# Patient Record
Sex: Male | Born: 1975 | Race: White | Hispanic: No | Marital: Married | Smoking: Never smoker
Health system: Southern US, Community
[De-identification: ages and names within clinical notes are randomized; demographics above are authoritative.]

## PROBLEM LIST (undated history)

## (undated) DIAGNOSIS — I10 Essential (primary) hypertension: Secondary | ICD-10-CM

## (undated) DIAGNOSIS — E7800 Pure hypercholesterolemia, unspecified: Secondary | ICD-10-CM

## (undated) DIAGNOSIS — T7840XA Allergy, unspecified, initial encounter: Secondary | ICD-10-CM

## (undated) DIAGNOSIS — K219 Gastro-esophageal reflux disease without esophagitis: Secondary | ICD-10-CM

## (undated) HISTORY — DX: Allergy, unspecified, initial encounter: T78.40XA

## (undated) HISTORY — DX: Gastro-esophageal reflux disease without esophagitis: K21.9

## (undated) HISTORY — PX: HERNIA REPAIR: SHX51

---

## 2011-12-23 DIAGNOSIS — E7849 Other hyperlipidemia: Secondary | ICD-10-CM | POA: Insufficient documentation

## 2013-10-15 DIAGNOSIS — I10 Essential (primary) hypertension: Secondary | ICD-10-CM | POA: Insufficient documentation

## 2014-02-06 DIAGNOSIS — I2510 Atherosclerotic heart disease of native coronary artery without angina pectoris: Secondary | ICD-10-CM | POA: Insufficient documentation

## 2016-01-22 DIAGNOSIS — D126 Benign neoplasm of colon, unspecified: Secondary | ICD-10-CM | POA: Insufficient documentation

## 2016-06-24 ENCOUNTER — Ambulatory Visit
Admission: EM | Admit: 2016-06-24 | Discharge: 2016-06-24 | Disposition: A | Discharge status: Home / Self Care | Payer: BLUE CROSS/BLUE SHIELD | Attending: Family Medicine | Admitting: Family Medicine

## 2016-06-24 ENCOUNTER — Encounter: Payer: Self-pay | Admitting: Emergency Medicine

## 2016-06-24 DIAGNOSIS — T148XXA Other injury of unspecified body region, initial encounter: Secondary | ICD-10-CM | POA: Diagnosis not present

## 2016-06-24 DIAGNOSIS — W57XXXA Bitten or stung by nonvenomous insect and other nonvenomous arthropods, initial encounter: Secondary | ICD-10-CM | POA: Diagnosis not present

## 2016-06-24 MED ORDER — DOXYCYCLINE HYCLATE 100 MG PO CAPS: 100.0000 mg | ORAL_CAPSULE | Freq: Two times a day (BID) | ORAL | 0 refills | Status: DC

## 2016-06-24 MED ORDER — MUPIROCIN 2 % EX OINT: TOPICAL_OINTMENT | CUTANEOUS | 0 refills | Status: DC

## 2016-06-24 NOTE — Discharge Instructions (Signed)
Take medication as prescribed. Rest. Drink plenty of fluids.  ° °Follow up with your primary care physician this week as needed. Return to Urgent care for new or worsening concerns.  ° °

## 2016-06-24 NOTE — ED Provider Notes (Signed)
[facility] ____________________________________________  Time seen: Approximately 8:49 AM  I have reviewed the triage vital signs and the nursing notes.   HISTORY  Chief Complaint Insect Bite   HPI Jared Riley is a 40 y.o. male presents for evaluation after tick bite. Patient reports that this past Tuesday his wife found an engorged deer tick attached to his right back. Patient states unsure of how long tick was attached as he is frequently outside, but suspects tick was attached 1-2 days. Patient states that his wife had to "dig the head out ". Reports when found take, he had a large area of redness surrounding that has since improved. Patient reports on Tuesday he began taking doxycycline 100 mg twice a day, that he had had left over from [facility]. Patient states the medication he thinks is about 41 years old. Patient presents medication did help improve the redness. States mild itching at the site currently. Patient reports that he does frequently travel and was most recently in [place].  Denies fevers, headaches, body aches, joint pain, nausea, vomiting, diarrhea. Patient reports other than having the redness area, he feels well.  SZABO, Maurine CaneANDREA TUNDE, MD: PCP   History reviewed. No pertinent past medical history.  There are no active problems to display for this patient.   Past Surgical History:  Procedure Laterality Date  . HERNIA REPAIR      No current facility-administered medications for this encounter.   Current Outpatient Prescriptions:  .  amLODipine (NORVASC) 2.5 MG tablet, Take 2.5 mg by mouth daily., Disp: , Rfl:  .  atorvastatin (LIPITOR) 40 MG tablet, Take 40 mg by mouth daily., Disp: , Rfl:  .  fenofibrate (TRICOR) 48 MG tablet, Take 48 mg by mouth daily., Disp: , Rfl:  .  doxycycline (VIBRAMYCIN) 100 MG capsule, Take 1 capsule (100 mg total) by mouth 2 (two) times daily., Disp: 20 capsule, Rfl:  0 .  mupirocin ointment (BACTROBAN) 2 %, Apply three times a day for 5 days., Disp: 22 g, Rfl: 0  Allergies Erythromycin  History reviewed. No pertinent family history.  Social History Social History  Substance Use Topics  . Smoking status: Never Smoker  . Smokeless tobacco: Never Used  . Alcohol use Yes    Review of Systems Constitutional: No fever/chills Eyes: No visual changes. ENT: No sore throat. Cardiovascular: Denies chest pain. Respiratory: Denies shortness of breath. Gastrointestinal: No abdominal pain.  No nausea, no vomiting.  No diarrhea.  No constipation. Genitourinary: Negative for dysuria. Musculoskeletal: Negative for back pain. Skin: As above.  Neurological: Negative for headaches, focal weakness or numbness.  10-point ROS otherwise negative.  ____________________________________________   PHYSICAL EXAM:  VITAL SIGNS: ED Triage Vitals  Enc Vitals Group     BP 06/24/16 0822 126/75     Pulse Rate 06/24/16 0822 66     Resp 06/24/16 0822 16     Temp 06/24/16 0822 97.8 F (36.6 C)     Temp Source 06/24/16 0822 Oral     SpO2 06/24/16 0822 100 %     Weight 06/24/16 0823 168 lb (76.2 kg)     Height 06/24/16 0823 5\' 7"  (1.702 m)     Head Circumference --      Peak Flow --      Pain Score 06/24/16 0825 0     Pain Loc --      Pain Edu? --      Excl.  in GC? --     Constitutional: Alert and oriented. Well appearing and in no acute distress. Eyes: Conjunctivae are normal. PERRL. EOMI. ENT      Head: Normocephalic and atraumatic.      Nose: No congestion/rhinnorhea.      Mouth/Throat: Mucous membranes are moist.Oropharynx non-erythematous. Neck: No stridor. Supple without meningismus.  Hematological/Lymphatic/Immunilogical: No cervical lymphadenopathy. Cardiovascular: Normal rate, regular rhythm. Grossly normal heart sounds.  Good peripheral circulation. Respiratory: Normal respiratory effort without tachypnea nor retractions. Breath sounds are clear  and equal bilaterally. No wheezes/rales/rhonchi. Gastrointestinal: Soft and nontender. No distention.  Musculoskeletal: Ambulatory with steady gait. No midline cervical, thoracic or lumbar tenderness to palpation.  Neurologic:  Normal speech and language. No gross focal neurologic deficits are appreciated. Speech is normal. No gait instability.  Skin:  Skin is warm, dry and intact. No rash noted. Except: Right posterior back just inferior to the shoulder blade 2 x 2 centimeter area of erythema and minimal induration with centered crusting, no visible foreign body or retained insect part, no fluctuance, no other erythema, no bull's-eye rash, nontender, no swelling. Psychiatric: Mood and affect are normal. Speech and behavior are normal. Patient exhibits appropriate insight and judgment   ___________________________________________   LABS (all labs ordered are listed, but only abnormal results are displayed)  Labs Reviewed  ROCKY MTN SPOTTED FVR ABS PNL(IGG+IGM)  B. BURGDORFI ANTIBODIES    PROCEDURES Procedures    INITIAL IMPRESSION / ASSESSMENT AND PLAN / ED COURSE  Pertinent labs & imaging results that were available during my care of the patient were reviewed by me and considered in my medical decision making (see chart for details).  Well-appearing patient. No acute distress. Patient with recent tick bite and attachment. From patient's description suspect patient having had cellulitis secondary to tick bite. No bull's-eye rash noted. Mild surrounding erythema and slight induration. No fluctuance. Discussed in detail treatment and evaluation options with patient. Will continue treatment with doxycycline and topical Bactroban. Discussed evaluating RMSF and Lyme disease labs, patient requests to have these labs performed. Encourage monitoring the area in keeping clean.Discussed indication, risks and benefits of medications with patient, including antibiotic photosensitivity.  Discussed  follow up with Primary care physician this week. Discussed follow up and return parameters including no resolution or any worsening concerns. Patient verbalized understanding and agreed to plan.   ____________________________________________   FINAL CLINICAL IMPRESSION(S) / ED DIAGNOSES  Final diagnoses:  Tick bite, initial encounter     New Prescriptions   DOXYCYCLINE (VIBRAMYCIN) 100 MG CAPSULE    Take 1 capsule (100 mg total) by mouth 2 (two) times daily.   MUPIROCIN OINTMENT (BACTROBAN) 2 %    Apply three times a day for 5 days.    Note: This dictation was prepared with Dragon dictation along with smaller phrase technology. Any transcriptional errors that result from this process are unintentional.    Clinical Course       Renford DillsLindsey Kariann Wecker, NP 06/24/16 1003

## 2016-06-24 NOTE — ED Triage Notes (Signed)
Patient c/o tick bite on Tuesday and started on Doxycycline which was an old prescription left over.  Patient reports redness around the site.

## 2016-06-26 ENCOUNTER — Ambulatory Visit
Admission: EM | Admit: 2016-06-26 | Discharge: 2016-06-26 | Disposition: A | Discharge status: Home / Self Care | Payer: BLUE CROSS/BLUE SHIELD | Attending: Family Medicine | Admitting: Family Medicine

## 2016-06-26 ENCOUNTER — Encounter: Payer: Self-pay | Admitting: Gynecology

## 2016-06-26 DIAGNOSIS — R51 Headache: Secondary | ICD-10-CM

## 2016-06-26 DIAGNOSIS — R519 Headache, unspecified: Secondary | ICD-10-CM

## 2016-06-26 DIAGNOSIS — T50905A Adverse effect of unspecified drugs, medicaments and biological substances, initial encounter: Secondary | ICD-10-CM

## 2016-06-26 DIAGNOSIS — T887XXA Unspecified adverse effect of drug or medicament, initial encounter: Secondary | ICD-10-CM

## 2016-06-26 HISTORY — DX: Pure hypercholesterolemia, unspecified: E78.00

## 2016-06-26 HISTORY — DX: Essential (primary) hypertension: I10

## 2016-06-26 MED ORDER — KETOROLAC TROMETHAMINE 60 MG/2ML IM SOLN
60.0000 mg | Freq: Once | INTRAMUSCULAR | Status: AC
  Administered 2016-06-26: 60 mg via INTRAMUSCULAR

## 2016-06-26 MED ORDER — DIPHENHYDRAMINE HCL 25 MG PO CAPS
25.0000 mg | ORAL_CAPSULE | Freq: Once | ORAL | Status: AC
  Administered 2016-06-26: 25 mg via ORAL

## 2016-06-26 NOTE — Discharge Instructions (Signed)
Rest. Drink plenty of fluids. Stop doxycycline as discussed.   Follow up with your primary care physician this week. Return to Urgent care or Emergency room for confusion, dizziness, vision changes, increased or unresolving headaches, new or worsening concerns.

## 2016-06-26 NOTE — ED Provider Notes (Signed)
[facility] ____________________________________________  Time seen: Approximately 4:23 PM  I have reviewed the triage vital signs and the nursing notes.   HISTORY  Chief Complaint Allergic Reaction  HPI Jared Riley is a 40 y.o. male presenting for concern of side effect from medication. Patient reports that since starting doxycycline or early this past Friday he has been having headaches, irritability and some difficulty focusing. States that he feels like he is in a bad mood. Patient reports that he has taken doxycycline in the past without side effects. Patient denies any other triggers or changes. Reports sleeping well. Denies vision changes or vision loss.  States headache is not constant but comes and goes. States gradual onset. Denies any abrupt onset. Denies this being the worst headache in his life. Denies fall, injury, head trauma. Denies fevers. Denies neck pain, neck stiffness or back pain. Patient reports and placed on doxycycline for recent tick bite as he is concerned that tick bite had become infected. Patient reports tick site looks well without any redness or drainage. Denies rashes. Denies confusion, ataxia, paresthesias.   Reports last doxycycline dose was last night. States otc ibuprofen helped slightly today, but no resolution. Patient reports that he did not fill the prescription for topical Bactroban and has not been using. Denies any other medication, food or contact changes.  Patient again denies any known triggers. He denies any stress, anxiety and injury or other contributing factors. Denies recent alcohol use or drug use. Reports continues to eat and drink well. Denies nausea, vomiting, diarrhea, abdominal pain, chest pain, shortness of breath, dysuria or extremity pain. Patient reports the symptoms only started after taking the oral doxycycline.  SZABO, Maurine CaneANDREA TUNDE, MD: PCP   Past Medical History:  Diagnosis Date  . Hypercholesteremia   .  Hypertension     There are no active problems to display for this patient.   Past Surgical History:  Procedure Laterality Date  . HERNIA REPAIR      No current facility-administered medications for this encounter.   Current Outpatient Prescriptions:  .  amLODipine (NORVASC) 2.5 MG tablet, Take 2.5 mg by mouth daily., Disp: , Rfl:  .  atorvastatin (LIPITOR) 40 MG tablet, Take 40 mg by mouth daily., Disp: , Rfl:  .  fenofibrate (TRICOR) 48 MG tablet, Take 48 mg by mouth daily., Disp: , Rfl:  .  mupirocin ointment (BACTROBAN) 2 %, Apply three times a day for 5 days., Disp: 22 g, Rfl: 0 .  doxycycline (VIBRAMYCIN) 100 MG capsule, Take 1 capsule (100 mg total) by mouth 2 (two) times daily., Disp: 20 capsule, Rfl: 0  Allergies Erythromycin  No family history on file.  Social History Social History  Substance Use Topics  . Smoking status: Never Smoker  . Smokeless tobacco: Never Used  . Alcohol use Yes    Review of Systems Constitutional: No fever/chills Eyes: No visual changes. ENT: No sore throat. Cardiovascular: Denies chest pain. Respiratory: Denies shortness of breath. Gastrointestinal: No abdominal pain.  No nausea, no vomiting.  No diarrhea.  No constipation. Genitourinary: Negative for dysuria. Musculoskeletal: Negative for back pain. Skin: Negative for rash. Neurological: Negative for focal weakness or numbness.  10-point ROS otherwise negative.  ____________________________________________   PHYSICAL EXAM:  VITAL SIGNS: ED Triage Vitals  Enc Vitals Group     BP 06/26/16 1551 (!) 141/96     Pulse Rate 06/26/16 1551 73     Resp 06/26/16 1551 16     Temp 06/26/16 1551 98.3  F (36.8 C)     Temp Source 06/26/16 1551 Oral     SpO2 06/26/16 1551 99 %     Weight 06/26/16 1551 168 lb (76.2 kg)     Height --      Head Circumference --      Peak Flow --      Pain Score 06/26/16 1553 7     Pain Loc --      Pain Edu? --      Excl. in GC? --    Vitals:    06/26/16 1551 06/26/16 1646  BP: (!) 141/96 125/82  Pulse: 73   Resp: 16   Temp: 98.3 F (36.8 C)   TempSrc: Oral   SpO2: 99%   Weight: 168 lb (76.2 kg)   \  Constitutional: Alert and oriented. Well appearing and in no acute distress. Eyes: Conjunctivae are normal. PERRL. EOMI. No pain with EOMs. Anterior chambers with limited bedside exam normal, no papilledema visualized.  Head: Atraumatic. No tenderness over temporal arteries. No sinus TTP. No tenderness to palpation.   Ears: no erythema, normal TMs bilaterally.   Nose: No congestion/rhinnorhea.  Mouth/Throat: Mucous membranes are moist.  Oropharynx non-erythematous. Neck: No stridor.  No cervical spine tenderness to palpation.  Hematological/Lymphatic/Immunilogical: No cervical lymphadenopathy. Cardiovascular: Normal rate, regular rhythm. Grossly normal heart sounds.  Good peripheral circulation. Respiratory: Normal respiratory effort.  No retractions. Lungs CTAB.No wheezes, rales or rhonchi. Gastrointestinal: Soft and nontender. No distention. Normal Bowel sounds.   Musculoskeletal: No lower or upper extremity tenderness nor edema. No cervical, thoracic or lumbar tenderness to palpation.  Neurologic:  Normal speech and language. No gross focal neurologic deficits are appreciated. No gait instability. No ataxia. Negative Romberg. No meningismus.   Skin:  Skin is warm, dry and intact. No rash noted. Except: Right posterior shoulder blade area of previous tick bite with less than 0.5 cm area of minimal erythema with center excoriated area, no bull's-eye rash, no surrounding erythema, nontender, no foreign body visualized, no fluctuance, no induration, no drainage. Psychiatric: Mood and affect are normal. Speech and behavior are normal.  ___________________________________________   LABS (all labs ordered are listed, but only abnormal results are displayed)  Labs Reviewed - No data to  display ____________________________________________  PROCEDURES Procedures   INITIAL IMPRESSION / ASSESSMENT AND PLAN / ED COURSE  Pertinent labs & imaging results that were available during my care of the patient were reviewed by me and considered in my medical decision making (see chart for details).  Very well-appearing patient. No acute distress. Presents with complaints of concern of side effect reaction from taking oral doxycycline. No focal neurological deficits. Exam unremarkable. However with continued complaints, 60 mg IM Toradol once, 25 mg oral Benadryl once. Discussed with patient we'll stop doxycycline, and await the previously drawn Lyme disease [facility] titers.  Patient reevaluated after the Toradol and Benadryl, patient reports that his headache is starting to improve. Patient reports is feeling a little bit better. Discussed with patient stopping doxycycline, as needed Benadryl, rest, fluids. Discussed PCP follow up. Discussed very strict follow-up and return parameters including up to proceeding to the emergency room for any confusion, vision changes or loss, increased headache, unresolving headache, paresthesias or worsening concerns.  Discussed follow up with Primary care physician this week. Discussed follow up and return parameters including no resolution or any worsening concerns. Patient verbalized understanding and agreed to plan.   ____________________________________________   FINAL CLINICAL IMPRESSION(S) / ED DIAGNOSES  Final diagnoses:  Medication reaction, initial encounter  Acute nonintractable headache, unspecified headache type     Discharge Medication List as of 06/26/2016  5:15 PM      Note: This dictation was prepared with Dragon dictation along with smaller phrase technology. Any transcriptional errors that result from this process are unintentional.    Clinical Course       Renford Dills, NP 06/26/16 1722     Renford Dills, NP 06/26/16 1724

## 2016-06-26 NOTE — ED Triage Notes (Signed)
Per patient was seen x 3 days and the Doxycycline he believe is having side effect.

## 2016-06-27 LAB — B. BURGDORFI ANTIBODIES: B burgdorferi Ab IgG+IgM: <0.91 {ISR} (ref 0.00–0.90)

## 2016-06-27 LAB — ROCKY MTN SPOTTED FVR ABS PNL(IGG+IGM)
RMSF IGG: NEGATIVE
RMSF IGM: 0.56 {index} (ref 0.00–0.89)

## 2017-03-15 DIAGNOSIS — K219 Gastro-esophageal reflux disease without esophagitis: Secondary | ICD-10-CM | POA: Insufficient documentation

## 2017-11-13 ENCOUNTER — Other Ambulatory Visit: Payer: Self-pay

## 2017-11-13 ENCOUNTER — Ambulatory Visit
Admission: EM | Admit: 2017-11-13 | Discharge: 2017-11-13 | Disposition: A | Discharge status: Home / Self Care | Payer: BLUE CROSS/BLUE SHIELD | Attending: Emergency Medicine | Admitting: Emergency Medicine

## 2017-11-13 DIAGNOSIS — M62838 Other muscle spasm: Secondary | ICD-10-CM

## 2017-11-13 DIAGNOSIS — J069 Acute upper respiratory infection, unspecified: Secondary | ICD-10-CM | POA: Diagnosis not present

## 2017-11-13 DIAGNOSIS — M542 Cervicalgia: Secondary | ICD-10-CM

## 2017-11-13 MED ORDER — METHOCARBAMOL 750 MG PO TABS: 750.0000 mg | ORAL_TABLET | ORAL | 0 refills | Status: DC

## 2017-11-13 MED ORDER — IBUPROFEN 600 MG PO TABS: 600.0000 mg | ORAL_TABLET | Freq: Four times a day (QID) | ORAL | 0 refills | Status: DC | PRN

## 2017-11-13 MED ORDER — FLUTICASONE PROPIONATE 50 MCG/ACT NA SUSP: 2.0000 | Freq: Every day | NASAL | 0 refills | Status: DC

## 2017-11-13 NOTE — Discharge Instructions (Addendum)
600 mg of ibuprofen to take with 1 g of Tylenol 3 or 4 times a day as needed for pain.  Gentle massage.  The Robaxin will help the muscle release.  Continue Mucinex, Flonase, start some saline nasal irrigation with a Lloyd HugerNeil med sinus rinse and distilled water to help prevent a bacterial sinus infection.

## 2017-11-13 NOTE — ED Triage Notes (Signed)
Pt reports one week ago he started with neck pain which gradually progressed until his neck was "locked up". Pain to right side trapezius. Pain 7/10. Pt also reports he started with sniffles and low grade fever

## 2017-11-13 NOTE — ED Provider Notes (Signed)
HPI  SUBJECTIVE:  Jared Riley is a 42 y.o. male who presents with 2 issues.  First he reports 1 week of right posterior neck pain described as constant, stabbing, tightness.  States that he cannot turn his head to the right.  States that it was getting better but then got worse several days ago.  He tried ibuprofen 800 mg twice daily with some improvement of symptoms.  Symptoms are worse with turning his head to his right, neck flexion.  No trauma to the neck.  States that he did not sleep on it wrong.  No change in his physical activity.  No arm or shoulder pain, distal numbness or tingling, grip weakness.  No sore throat, swelling.  No strokelike symptoms including facial droop, arm leg weakness, dysarthria, discoordination.  He has never had symptoms like this before.  Second, he reports yellow nasal congestion, fevers, T-max 101, sore throat, sinus pain and pressure, postnasal drip, cough starting 2 days ago.  No ear pain.  No wheezing, body aches, headaches, shortness of breath.  States that his upper teeth do not hurt.  No sensation of throat swelling shut, voice changes.  He tried Mucinex with improvement in his symptoms.  He also tried Advil cold sinus.  No aggravating factors.  He did not get a flu shot this year.  He travels a lot he is not sure if he has been around anyone who has had the flu.  3 of hypercholesterolemia, hypertension.  ZOX:[id number]:Szabo, Maurine CaneAndrea Tunde, MD   Past Medical History:  Diagnosis Date  . Hypercholesteremia   . Hypertension     Past Surgical History:  Procedure Laterality Date  . HERNIA REPAIR      History reviewed. No pertinent family history.  Social History   Tobacco Use  . Smoking status: Never Smoker  . Smokeless tobacco: Never Used  Substance Use Topics  . Alcohol use: Yes    Comment: social  . Drug use: No    No current facility-administered medications for this encounter.   Current Outpatient Medications:  .  atorvastatin (LIPITOR) 40 MG  tablet, Take 40 mg by mouth daily., Disp: , Rfl:  .  fenofibrate (TRICOR) 48 MG tablet, Take 48 mg by mouth daily., Disp: , Rfl:  .  fluticasone (FLONASE) 50 MCG/ACT nasal spray, Place 2 sprays into both nostrils daily., Disp: 16 g, Rfl: 0 .  ibuprofen (ADVIL,MOTRIN) 600 MG tablet, Take 1 tablet (600 mg total) by mouth every 6 (six) hours as needed., Disp: 30 tablet, Rfl: 0 .  methocarbamol (ROBAXIN) 750 MG tablet, Take 1 tablet (750 mg total) by mouth every 4 (four) hours., Disp: 40 tablet, Rfl: 0  Allergies  Allergen Reactions  . Erythromycin Other (See Comments)     ROS  As noted in HPI.   Physical Exam  BP 123/86 (BP Location: Left Arm)   Pulse (!) 105   Temp 98.3 F (36.8 C) (Oral)   Resp 18   Ht 5\' 7"  (1.702 m)   Wt 171 lb (77.6 kg)   SpO2 97%   BMI 26.78 kg/m   Constitutional: Well developed, well nourished, no acute distress Eyes: PERRL, EOMI, conjunctiva normal bilaterally HENT: Normocephalic, atraumatic,mucus membranes moist.  Positive nasal congestion.  Normal turbinates.  No sinus tenderness.  Normal tonsils.  Uvula midline.  Positive cobblestoning and postnasal drip. Neck: Positive tenderness and muscle spasm along the right trapezius.  No strap muscle tenderness.  No cervical lymphadenopathy, no meningismus.  No C-spine tenderness Pt  has full range of motion of the neck. Respiratory: Clear to auscultation bilaterally, no rales, no wheezing, no rhonchi Cardiovascular: Regular tachycardia, no murmurs, no gallops, no rubs GI:  nondistended, skin: No rash, skin intact Musculoskeletal: No edema, no tenderness, no deformities Neurologic: Alert & oriented x 3, CN II-XII grossly intact, no motor deficits, sensation grossly intact Psychiatric: Speech and behavior appropriate   ED Course   Medications - No data to display  No orders of the defined types were placed in this encounter.  No results found for this or any previous visit (from the past 24 hour(s)). No  results found.  ED Clinical Impression  Muscle spasms of neck  Acute upper respiratory infection   ED Assessment/Plan   1.  Neck pain.  Appears to be muscle spasm at this point in time.  Imaging deferred in the absence of bony tenderness or trauma.  Doubt infectious cause.  Home with 600 mg ibuprofen with 1 g of Tylenol 3 or 4 times a day, Robaxin.  Patient declined prescription for Norco.  2.  URI.  No evidence of sinusitis, meningitis, pneumonia.  Will send home with Flonase, continue Mucinex, push fluids.  Patient declined flu and strep testing.  Follow-up with PMD as needed.  Discussed MDM, treatment plan, and plan for follow-up with patient Discussed sn/sx that should prompt return to the ED. patient agrees with plan.   Meds ordered this encounter  Medications  . ibuprofen (ADVIL,MOTRIN) 600 MG tablet    Sig: Take 1 tablet (600 mg total) by mouth every 6 (six) hours as needed.    Dispense:  30 tablet    Refill:  0  . methocarbamol (ROBAXIN) 750 MG tablet    Sig: Take 1 tablet (750 mg total) by mouth every 4 (four) hours.    Dispense:  40 tablet    Refill:  0  . fluticasone (FLONASE) 50 MCG/ACT nasal spray    Sig: Place 2 sprays into both nostrils daily.    Dispense:  16 g    Refill:  0    *This clinic note was created using Scientist, clinical (histocompatibility and immunogenetics). Therefore, there may be occasional mistakes despite careful proofreading.  ?   Domenick Gong, MD 11/13/17 1042

## 2020-06-28 ENCOUNTER — Ambulatory Visit
Admission: EM | Admit: 2020-06-28 | Discharge: 2020-06-28 | Disposition: A | Discharge status: Home / Self Care | Payer: BLUE CROSS/BLUE SHIELD | Attending: Emergency Medicine | Admitting: Emergency Medicine

## 2020-06-28 ENCOUNTER — Encounter: Payer: Self-pay | Admitting: Emergency Medicine

## 2020-06-28 ENCOUNTER — Other Ambulatory Visit: Payer: Self-pay

## 2020-06-28 DIAGNOSIS — R059 Cough, unspecified: Secondary | ICD-10-CM

## 2020-06-28 MED ORDER — BENZONATATE 100 MG PO CAPS: 200.0000 mg | ORAL_CAPSULE | Freq: Three times a day (TID) | ORAL | 0 refills | Status: DC

## 2020-06-28 MED ORDER — ALBUTEROL SULFATE HFA 108 (90 BASE) MCG/ACT IN AERS: 2.0000 | INHALATION_SPRAY | RESPIRATORY_TRACT | 0 refills | Status: DC | PRN

## 2020-06-28 MED ORDER — AEROCHAMBER MV MISC: 2 refills | Status: DC

## 2020-06-28 NOTE — Discharge Instructions (Addendum)
Use the albuterol inhaler with the spacer, 2 puffs every 4-6 hours, as needed for cough and shortness of breath.  Use the Tessalon Perles every 8 hours as needed for cough.  Your cough and shortness of breath may last 6 weeks or longer status post Covid.  If your symptoms last beyond 6 weeks discussed with your primary care provider about a referral to the [facility] long-haul clinic.  You can also call [phone]

## 2020-06-28 NOTE — ED Provider Notes (Signed)
[facility]    CSN: [id number] Arrival date & time: 06/28/20  1506      History   Chief Complaint Chief Complaint  Patient presents with  . Cough  . Shortness of Breath    HPI Jared Riley is a 44 y.o. male.   HPI   44 year old male who is here for evaluation of continued cough and shortness of breath status post Covid.  Patient was diagnosed with Covid on November 5 and got out of his quarantine and November 19.  For the past 3 weeks he has continued to have a dry cough that is worse in the morning at nights or when he takes a deep breath.  Patient has some shortness of breath that is mainly present with exertion.  Patient denies fever or URI symptoms.  Past Medical History:  Diagnosis Date  . Hypercholesteremia   . Hypertension     There are no problems to display for this patient.   Past Surgical History:  Procedure Laterality Date  . HERNIA REPAIR         Home Medications    Prior to Admission medications   Medication Sig Start Date End Date Taking? Authorizing Provider  atorvastatin (LIPITOR) 40 MG tablet Take 40 mg by mouth daily.   Yes [provider]  fenofibrate (TRICOR) 48 MG tablet Take 48 mg by mouth daily.   Yes [provider]  albuterol (VENTOLIN HFA) 108 (90 Base) MCG/ACT inhaler Inhale 2 puffs into the lungs every 4 (four) hours as needed for wheezing or shortness of breath. 06/28/20   Becky Augusta, NP  benzonatate (TESSALON) 100 MG capsule Take 2 capsules (200 mg total) by mouth every 8 (eight) hours. 06/28/20   Becky Augusta, NP  ibuprofen (ADVIL,MOTRIN) 600 MG tablet Take 1 tablet (600 mg total) by mouth every 6 (six) hours as needed. 11/13/17   Domenick Gong, MD  Spacer/Aero-Holding Chambers (AEROCHAMBER MV) inhaler Use as instructed 06/28/20   Becky Augusta, NP  fluticasone [facility]) 50 MCG/ACT nasal spray Place 2 sprays into both nostrils daily. 11/13/17 06/28/20  Domenick Gong, MD    Family History History  reviewed. No pertinent family history.  Social History Social History   Tobacco Use  . Smoking status: Never Smoker  . Smokeless tobacco: Never Used  Vaping Use  . Vaping Use: Never used  Substance Use Topics  . Alcohol use: Yes    Comment: social  . Drug use: No     Allergies   Erythromycin   Review of Systems Review of Systems  Constitutional: Negative for activity change, appetite change and fever.  HENT: Negative for congestion, rhinorrhea and sore throat.   Respiratory: Positive for cough and shortness of breath.   Cardiovascular: Negative for chest pain.  Skin: Negative.   Hematological: Negative.   Psychiatric/Behavioral: Negative.      Physical Exam Triage Vital Signs ED Triage Vitals  Enc Vitals Group     BP 06/28/20 1600 (!) 117/98     Pulse Rate 06/28/20 1600 69     Resp 06/28/20 1600 16     Temp 06/28/20 1600 98 F (36.7 C)     Temp Source 06/28/20 1600 Oral     SpO2 06/28/20 1600 98 %     Weight 06/28/20 1558 175 lb (79.4 kg)     Height 06/28/20 1558 5\' 7"  (1.702 m)     Head Circumference --      Peak Flow --      Pain  Score 06/28/20 1558 0     Pain Loc --      Pain Edu? --      Excl. in GC? --    No data found.  Updated Vital Signs BP (!) 117/98 (BP Location: Left Arm)   Pulse 69   Temp 98 F (36.7 C) (Oral)   Resp 16   Ht 5\' 7"  (1.702 m)   Wt 175 lb (79.4 kg)   SpO2 98%   BMI 27.41 kg/m   Visual Acuity Right Eye Distance:   Left Eye Distance:   Bilateral Distance:    Right Eye Near:   Left Eye Near:    Bilateral Near:     Physical Exam Vitals and nursing note reviewed.  Constitutional:      General: He is not in acute distress.    Appearance: He is well-developed and normal weight. He is not toxic-appearing.  HENT:     Head: Normocephalic and atraumatic.  Cardiovascular:     Rate and Rhythm: Normal rate and regular rhythm.     Heart sounds: No murmur heard.  No gallop.   Pulmonary:     Effort: Pulmonary effort is  normal. No respiratory distress.     Breath sounds: Normal breath sounds. No decreased breath sounds, wheezing, rhonchi or rales.  Musculoskeletal:        General: Normal range of motion.     Right lower leg: No tenderness. No edema.     Left lower leg: No tenderness.  Skin:    General: Skin is warm and dry.     Findings: No erythema or rash.  Neurological:     General: No focal deficit present.     Mental Status: He is alert and oriented to person, place, and time.  Psychiatric:        Mood and Affect: Mood normal.        Behavior: Behavior normal.      UC Treatments / Results  Labs (all labs ordered are listed, but only abnormal results are displayed) Labs Reviewed - No data to display  EKG   Radiology No results found.  Procedures Procedures (including critical care time)  Medications Ordered in UC Medications - No data to display  Initial Impression / Assessment and Plan / UC Course  I have reviewed the triage vital signs and the nursing notes.  Pertinent labs & imaging results that were available during my care of the patient were reviewed by me and considered in my medical decision making (see chart for details).   Patient is here for evaluation of ongoing cough and congestion 3 weeks status post Covid.  Patient is nontoxic-appearing lung sounds are clear to auscultation all fields.  Patient did have one episode of wheezing when he took a deep breath that triggered a cough but otherwise his lung sounds are clear.  Discussed with patient that Covid is an inflammatory process and that he may have a cough and some shortness of breath with exertion for up to 6 weeks afterwards.  Some people do go longer than that and we can refer him to the long-haul clinic.  Will give Tessalon Perles help with cough and albuterol inhaler with spacer.   Final Clinical Impressions(s) / UC Diagnoses   Final diagnoses:  Cough     Discharge Instructions     Use the albuterol inhaler  with the spacer, 2 puffs every 4-6 hours, as needed for cough and shortness of breath.  Use the  every 8 hours as needed for cough.  Your cough and shortness of breath may last 6 weeks or longer status post Covid.  If your symptoms last beyond 6 weeks discussed with your primary care provider about a referral to the [facility] long-haul clinic.  You can also call [phone]    ED Prescriptions    Medication Sig Dispense Auth. Provider   albuterol (VENTOLIN HFA) 108 (90 Base) MCG/ACT inhaler Inhale 2 puffs into the lungs every 4 (four) hours as needed for wheezing or shortness of breath. 18 g Becky Augusta, NP   Spacer/Aero-Holding Chambers (AEROCHAMBER MV) inhaler Use as instructed 1 each Becky Augusta, NP   benzonatate (TESSALON) 100 MG capsule Take 2 capsules (200 mg total) by mouth every 8 (eight) hours. 21 capsule Becky Augusta, NP     PDMP not reviewed this encounter.   Becky Augusta, NP 06/28/20 [phone]

## 2020-06-28 NOTE — ED Triage Notes (Signed)
Patient states that he had COVID and is finished with his quarintine period.  Patient reports ongoing cough and SOB.  Patient reports low grade fevers.

## 2021-06-08 HISTORY — PX: TENDON REPAIR: SHX5111

## 2021-12-21 ENCOUNTER — Ambulatory Visit
Admission: EM | Admit: 2021-12-21 | Discharge: 2021-12-21 | Disposition: A | Discharge status: Home / Self Care | Payer: BC Managed Care – PPO | Attending: Internal Medicine | Admitting: Internal Medicine

## 2021-12-21 DIAGNOSIS — S81811A Laceration without foreign body, right lower leg, initial encounter: Secondary | ICD-10-CM | POA: Diagnosis not present

## 2021-12-21 DIAGNOSIS — Z23 Encounter for immunization: Secondary | ICD-10-CM

## 2021-12-21 MED ORDER — TETANUS-DIPHTH-ACELL PERTUSSIS 5-2.5-18.5 LF-MCG/0.5 IM SUSY
0.5000 mL | PREFILLED_SYRINGE | Freq: Once | INTRAMUSCULAR | Status: AC
  Administered 2021-12-21: 0.5 mL via INTRAMUSCULAR

## 2021-12-21 NOTE — ED Triage Notes (Signed)
Patient cut his right shin on a some roofing metal -- happened today @ about 2pm   Last tetanus was about 8 years ago per patient.

## 2021-12-21 NOTE — ED Provider Notes (Signed)
[facility]    CSN: [id number] Arrival date & time: 12/21/21  1923      History   Chief Complaint Chief Complaint  Patient presents with   Laceration    Right leg     HPI Jared Riley is a 46 y.o. male who presents with cut on R shin at 2 pm. He was helping his neighbor pick up a metal sheet and hit his R shin over his pants which they did not get cut. He applied a dressing with coban and continued with his day. His wife asked him to take it off to look at it, and the cut continued bleeding and the dressing was soaked. His last TD he thinks was 8 years ago.     Past Medical History:  Diagnosis Date   Hypercholesteremia    Hypertension     There are no problems to display for this patient.   Past Surgical History:  Procedure Laterality Date   HERNIA REPAIR     TENDON REPAIR Right 06/08/2021       Home Medications    Prior to Admission medications   Medication Sig Start Date End Date Taking? Authorizing Provider  albuterol (VENTOLIN HFA) 108 (90 Base) MCG/ACT inhaler Inhale 2 puffs into the lungs every 4 (four) hours as needed for wheezing or shortness of breath. 06/28/20  Yes Becky Augusta, NP  atorvastatin (LIPITOR) 40 MG tablet Take 40 mg by mouth daily.   Yes [provider]  benzonatate (TESSALON) 100 MG capsule Take 2 capsules (200 mg total) by mouth every 8 (eight) hours. 06/28/20  Yes Becky Augusta, NP  fenofibrate (TRICOR) 48 MG tablet Take 48 mg by mouth daily.   Yes [provider]  ibuprofen (ADVIL,MOTRIN) 600 MG tablet Take 1 tablet (600 mg total) by mouth every 6 (six) hours as needed. 11/13/17  Yes Domenick Gong, MD  Spacer/Aero-Holding Chambers (AEROCHAMBER MV) inhaler Use as instructed 06/28/20  Yes Becky Augusta, NP  fluticasone [facility]) 50 MCG/ACT nasal spray Place 2 sprays into both nostrils daily. 11/13/17 06/28/20  Domenick Gong, MD    Family History History reviewed. No pertinent family history.  Social  History Social History   Tobacco Use   Smoking status: Never   Smokeless tobacco: Never  Vaping Use   Vaping Use: Never used  Substance Use Topics   Alcohol use: Yes    Comment: social   Drug use: No     Allergies   Erythromycin   Review of Systems Review of Systems   Physical Exam Triage Vital Signs ED Triage Vitals [12/21/21 1931]  Enc Vitals Group     BP (!) 149/106     Pulse Rate 95     Resp      Temp 98.6 F (37 C)     Temp src      SpO2 99 %     Weight 175 lb (79.4 kg)     Height 5\' 7"  (1.702 m)     Head Circumference      Peak Flow      Pain Score 0     Pain Loc      Pain Edu?      Excl. in GC?    No data found.  Updated Vital Signs BP (!) 149/106 (BP Location: Left Arm)   Pulse 95   Temp 98.6 F (37 C)   Ht 5\' 7"  (1.702 m)   Wt 175 lb (79.4 kg)   SpO2 99%  BMI 27.41 kg/m   Visual Acuity Right Eye Distance:   Left Eye Distance:   Bilateral Distance:    Right Eye Near:   Left Eye Near:    Bilateral Near:     Physical Exam Vitals and nursing note reviewed.  Constitutional:      General: He is not in acute distress.    Appearance: He is not toxic-appearing.  Eyes:     General: No scleral icterus.    Conjunctiva/sclera: Conjunctivae normal.  Pulmonary:     Effort: Pulmonary effort is normal.  Musculoskeletal:     Cervical back: Neck supple.  Skin:    General: Skin is warm and dry.     Comments: Has a 2 cm cut on center of mid R shin with moderate bleeding. The cut is clean  Neurological:     Mental Status: He is alert and oriented to person, place, and time.     Gait: Gait normal.  Psychiatric:        Mood and Affect: Mood normal.        Behavior: Behavior normal.        Thought Content: Thought content normal.        Judgment: Judgment normal.     UC Treatments / Results  Labs (all labs ordered are listed, but only abnormal results are displayed) Labs Reviewed - No data to display  EKG   Radiology No results  found.  Procedures Laceration Repair  Date/Time: 12/21/2021 8:03 PM Performed by: Shelby Mattocks, PA-C Authorized by: Shelby Mattocks, PA-C   Consent:    Consent obtained:  Verbal   Consent given by:  Patient Universal protocol:    Patient identity confirmed:  Verbally with patient Laceration details:    Location:  Leg   Leg location:  R lower leg   Length (cm):  2   Depth (mm):  2 Pre-procedure details:    Preparation:  Patient was prepped and draped in usual sterile fashion Exploration:    Imaging outcome: foreign body not noted     Wound exploration: entire depth of wound visualized     Wound extent: areolar tissue violated     Contaminated: no   Treatment:    Area cleansed with:  Povidone-iodine   Irrigation solution:  Sterile water   Irrigation volume:  10   Visualized foreign bodies/material removed: no     Debridement:  None Skin repair:    Repair method:  Sutures   Suture size:  5-0 (we are out of 4-0 nylon)   Suture technique:  Simple interrupted and vertical mattress   Number of sutures:  3 Approximation:    Approximation:  Close Repair type:    Repair type:  Simple Post-procedure details:    Dressing:  Antibiotic ointment and sterile dressing   Procedure completion:  Tolerated Comments:     Bleeding stopped after stitching  (including critical care time)  Medications Ordered in UC Medications  Tdap (BOOSTRIX) injection 0.5 mL (0.5 mLs Intramuscular Given 12/21/21 2000)    Initial Impression / Assessment and Plan / UC Course  I have reviewed the triage vital signs and the nursing notes.  Laceration R lower leg  He was given a TDAP Wound care instructions reviewed with pt. See instructions    Final Clinical Impressions(s) / UC Diagnoses   Final diagnoses:  Laceration of right lower leg, initial encounter  Need for immunization against tetanus alone     Discharge Instructions      Keep  the dressing clean and  dry for 2 days. You may change it. After that is OK to take showers and wash area with soap and water. Have sutures removed in 10 days Watch out for signs of infection, like redness and hotness or increased pain, if so come back.      ED Prescriptions   None    PDMP not reviewed this encounter.   Shelby Mattocks, Hershal Coria 12/21/21 2007

## 2021-12-21 NOTE — Discharge Instructions (Signed)
Keep the dressing clean and dry for 2 days. You may change it. After that is OK to take showers and wash area with soap and water. Have sutures removed in 10 days Watch out for signs of infection, like redness and hotness or increased pain, if so come back.

## 2022-01-06 ENCOUNTER — Ambulatory Visit
Admission: EM | Admit: 2022-01-06 | Discharge: 2022-01-06 | Disposition: A | Discharge status: Home / Self Care | Payer: BC Managed Care – PPO | Attending: Internal Medicine | Admitting: Internal Medicine

## 2022-01-06 ENCOUNTER — Ambulatory Visit (INDEPENDENT_AMBULATORY_CARE_PROVIDER_SITE_OTHER): Payer: BC Managed Care – PPO

## 2022-01-06 DIAGNOSIS — R059 Cough, unspecified: Secondary | ICD-10-CM | POA: Diagnosis not present

## 2022-01-06 DIAGNOSIS — R051 Acute cough: Secondary | ICD-10-CM | POA: Diagnosis not present

## 2022-01-06 DIAGNOSIS — J9801 Acute bronchospasm: Secondary | ICD-10-CM

## 2022-01-06 MED ORDER — BENZONATATE 100 MG PO CAPS: 200.0000 mg | ORAL_CAPSULE | Freq: Three times a day (TID) | ORAL | 0 refills | Status: DC

## 2022-01-06 MED ORDER — PREDNISONE 20 MG PO TABS: 20.0000 mg | ORAL_TABLET | Freq: Every day | ORAL | 0 refills | Status: DC

## 2022-01-06 NOTE — ED Triage Notes (Signed)
Pt present cough with throat pain from coughing. Symptoms started on Sunday. Pt thought it was allergies but symptoms has not gotten better.

## 2022-01-06 NOTE — ED Provider Notes (Signed)
[facility]    CSN: [id number] Arrival date & time: 01/06/22  1044      History   Chief Complaint Chief Complaint  Patient presents with   Cough    HPI Jared Riley is a 46 y.o. male who presents with onset of cough x 4 days and is coughing so hard is hurting his throat. His cough is not getting better. He thought initially was his allergies. Has been having cough attacks. The cough is worse to lay down, or change positions from laying to standing. [place] this [place] and when he passed by some plants his cough seem to get worse.    Past Medical History:  Diagnosis Date   Hypercholesteremia    Hypertension     There are no problems to display for this patient.   Past Surgical History:  Procedure Laterality Date   HERNIA REPAIR     TENDON REPAIR Right 06/08/2021       Home Medications    Prior to Admission medications   Medication Sig Start Date End Date Taking? Authorizing Provider  predniSONE (DELTASONE) 20 MG tablet Take 1 tablet (20 mg total) by mouth daily with breakfast. 01/06/22  Yes Rodriguez-Southworth, Nettie Elm, PA-C  albuterol (VENTOLIN HFA) 108 (90 Base) MCG/ACT inhaler Inhale 2 puffs into the lungs every 4 (four) hours as needed for wheezing or shortness of breath. 06/28/20   Becky Augusta, NP  atorvastatin (LIPITOR) 40 MG tablet Take 40 mg by mouth daily.    [provider]  benzonatate (TESSALON) 100 MG capsule Take 2 capsules (200 mg total) by mouth 3 (three) times daily. 01/06/22   Rodriguez-Southworth, Nettie Elm, PA-C  fenofibrate (TRICOR) 48 MG tablet Take 48 mg by mouth daily.    [provider]  ibuprofen (ADVIL,MOTRIN) 600 MG tablet Take 1 tablet (600 mg total) by mouth every 6 (six) hours as needed. 11/13/17   Domenick Gong, MD  Spacer/Aero-Holding Chambers (AEROCHAMBER MV) inhaler Use as instructed 06/28/20   Becky Augusta, NP  fluticasone [facility]) 50 MCG/ACT nasal spray Place 2 sprays into both  nostrils daily. 11/13/17 06/28/20  Domenick Gong, MD    Family History History reviewed. No pertinent family history.  Social History Social History   Tobacco Use   Smoking status: Never   Smokeless tobacco: Never  Vaping Use   Vaping Use: Never used  Substance Use Topics   Alcohol use: Yes    Comment: social   Drug use: No     Allergies   Erythromycin   Review of Systems Review of Systems  Constitutional:  Negative for appetite change, chills, diaphoresis and fever.  HENT:  Positive for sore throat. Negative for congestion, ear discharge, ear pain, postnasal drip and rhinorrhea.   Eyes:  Negative for discharge.  Respiratory:  Positive for cough. Negative for shortness of breath and wheezing.   Musculoskeletal:  Negative for myalgias.  Skin:  Negative for rash.  Neurological:  Negative for headaches.     Physical Exam Triage Vital Signs ED Triage Vitals  Enc Vitals Group     BP 01/06/22 1100 (!) 139/94     Pulse Rate 01/06/22 1100 83     Resp 01/06/22 1100 18     Temp 01/06/22 1100 98.5 F (36.9 C)     Temp Source 01/06/22 1100 Oral     SpO2 01/06/22 1100 95 %     Weight --      Height --  Head Circumference --      Peak Flow --      Pain Score 01/06/22 1101 0     Pain Loc --      Pain Edu? --      Excl. in GC? --    No data found.  Updated Vital Signs BP (!) 139/94 (BP Location: Left Arm)   Pulse 83   Temp 98.5 F (36.9 C) (Oral)   Resp 18   SpO2 95%   Visual Acuity Right Eye Distance:   Left Eye Distance:   Bilateral Distance:    Right Eye Near:   Left Eye Near:    Bilateral Near:       Physical Exam Vitals signs and nursing note reviewed. I repeated his pulse ox at discharge and was 100% Constitutional:      General: She is not in acute distress.    Appearance: Normal appearance. She is not ill-appearing, toxic-appearing or diaphoretic.  HENT:     Head: Normocephalic.     Right Ear: Tympanic membrane, ear canal and external  ear normal.     Left Ear: Tympanic membrane, ear canal and external ear normal.     Nose: Nose normal.     Mouth/Throat:     Mouth: Mucous membranes are moist.  Eyes:     General: No scleral icterus.       Right eye: No discharge.        Left eye: No discharge.     Conjunctiva/sclera: Conjunctivae normal.  Neck:     Musculoskeletal: Neck supple. No neck rigidity.  Cardiovascular:     Rate and Rhythm: Normal rate and regular rhythm.     Heart sounds: No murmur.  Pulmonary:     Effort: Pulmonary effort is normal.     Breath sounds: Normal breath sounds.  Musculoskeletal: Normal range of motion.  Lymphadenopathy:     Cervical: No cervical adenopathy.  Skin:    General: Skin is warm and dry.     Coloration: Skin is not jaundiced.     Findings: No rash.  Neurological:     Mental Status: She is alert and oriented to person, place, and time.     Gait: Gait normal.  Psychiatric:        Mood and Affect: Mood normal.        Behavior: Behavior normal.        Thought Content: Thought content normal.        Judgment: Judgment normal.   UC Treatments / Results  Labs (all labs ordered are listed, but only abnormal results are displayed) Labs Reviewed - No data to display  EKG   Radiology DG Chest 2 View  Result Date: 01/06/2022 CLINICAL DATA:  Cough EXAM: CHEST - 2 VIEW COMPARISON:  None Available. FINDINGS: The heart size and mediastinal contours are within normal limits. Both lungs are clear. The visualized skeletal structures are unremarkable. IMPRESSION: No active cardiopulmonary disease. Electronically Signed   By: Duanne Guess D.O.   On: 01/06/2022 11:30    Procedures Procedures (including critical care time)  Medications Ordered in UC Medications - No data to display  Initial Impression / Assessment and Plan / UC Course  I have reviewed the triage vital signs and the nursing notes.  Pertinent  imaging results that were available during my care of the patient were  reviewed by me and considered in my medical decision making (see chart for details).  I believe he is having bronchospasm from  allegies  I placed him on Tessalon and prednisone as noted.    Final Clinical Impressions(s) / UC Diagnoses   Final diagnoses:  Acute cough  Bronchospasm, acute   Discharge Instructions   None    ED Prescriptions     Medication Sig Dispense Auth. Provider   predniSONE (DELTASONE) 20 MG tablet Take 1 tablet (20 mg total) by mouth daily with breakfast. 3 tablet Rodriguez-Southworth, Nettie Elm, PA-C   benzonatate (TESSALON) 100 MG capsule Take 2 capsules (200 mg total) by mouth 3 (three) times daily. 30 capsule Rodriguez-Southworth, Nettie Elm, [place]      I have reviewed the PDMP during this encounter.   Garey Ham, PA-C 01/06/22 1159

## 2022-03-30 DIAGNOSIS — R911 Solitary pulmonary nodule: Secondary | ICD-10-CM | POA: Insufficient documentation

## 2022-07-05 IMAGING — CR DG CHEST 2V
3 series · 3 of 3 positions shown · non-contrast
Comparison: None Available.

CLINICAL DATA: Cough

EXAM:
CHEST - 2 VIEW

[chest pa (1 of 2)]
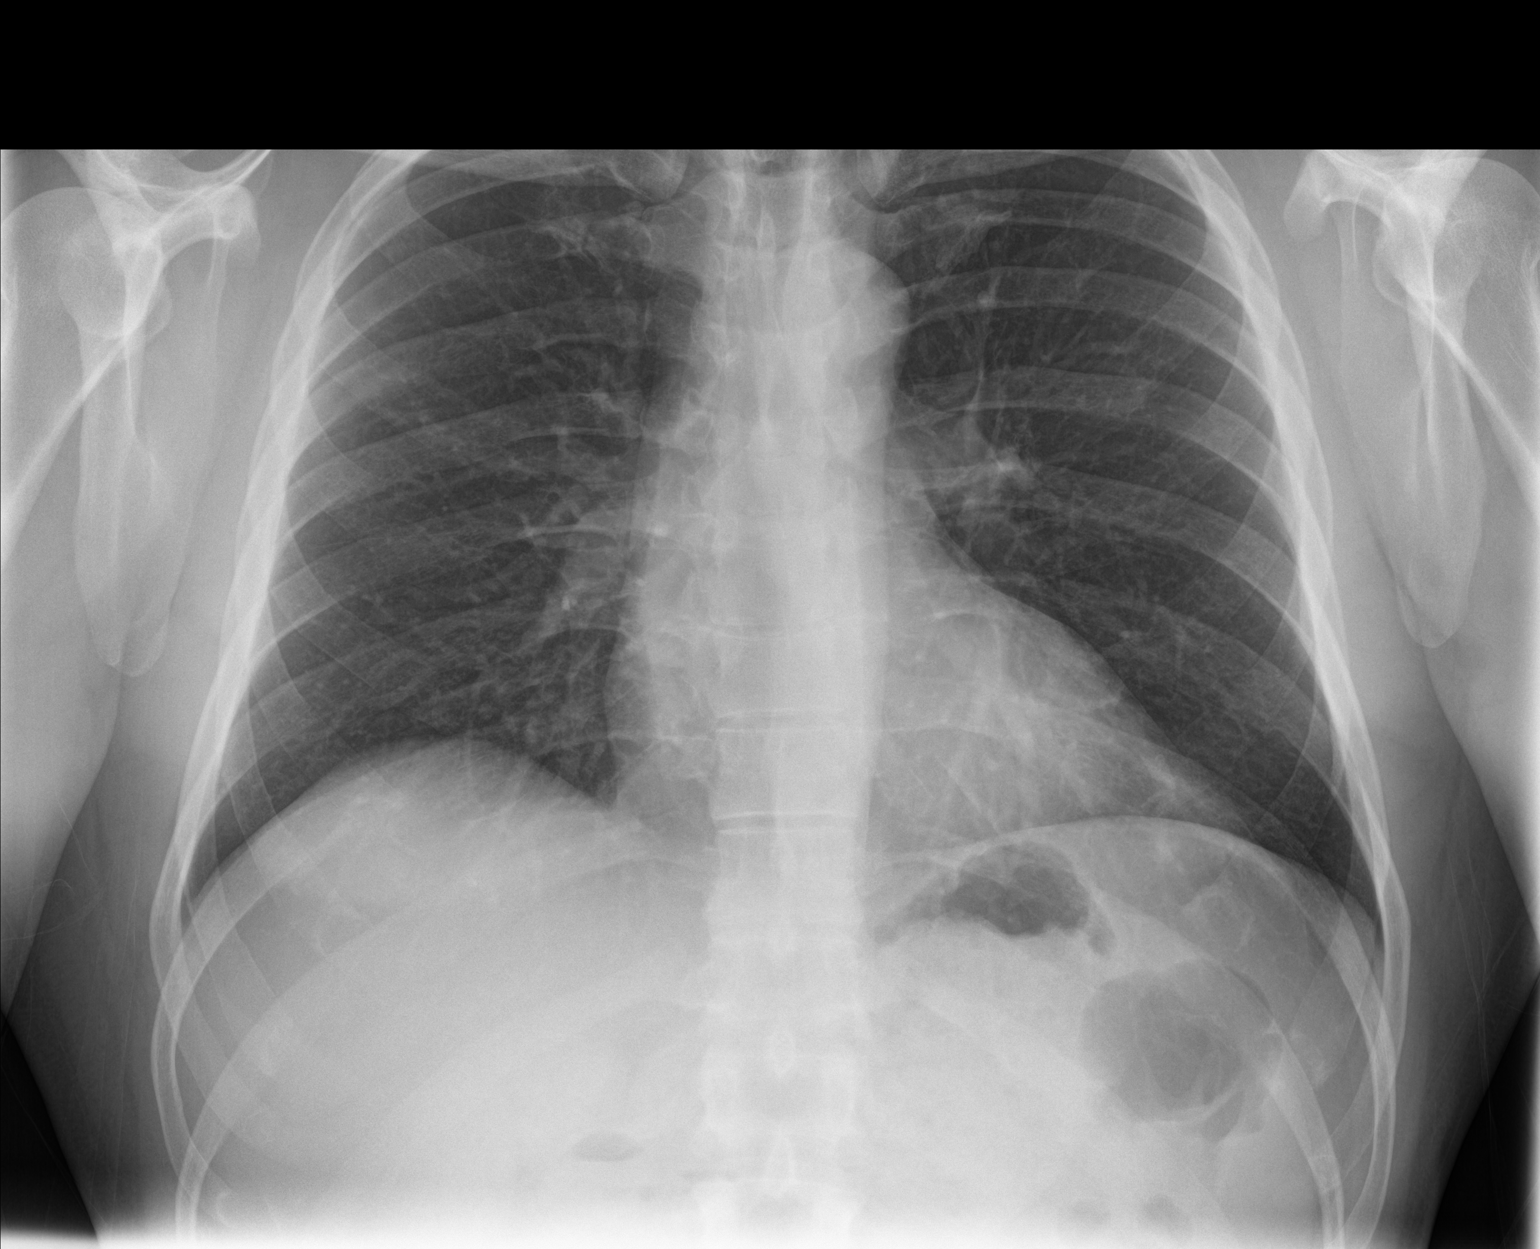

[chest lat]
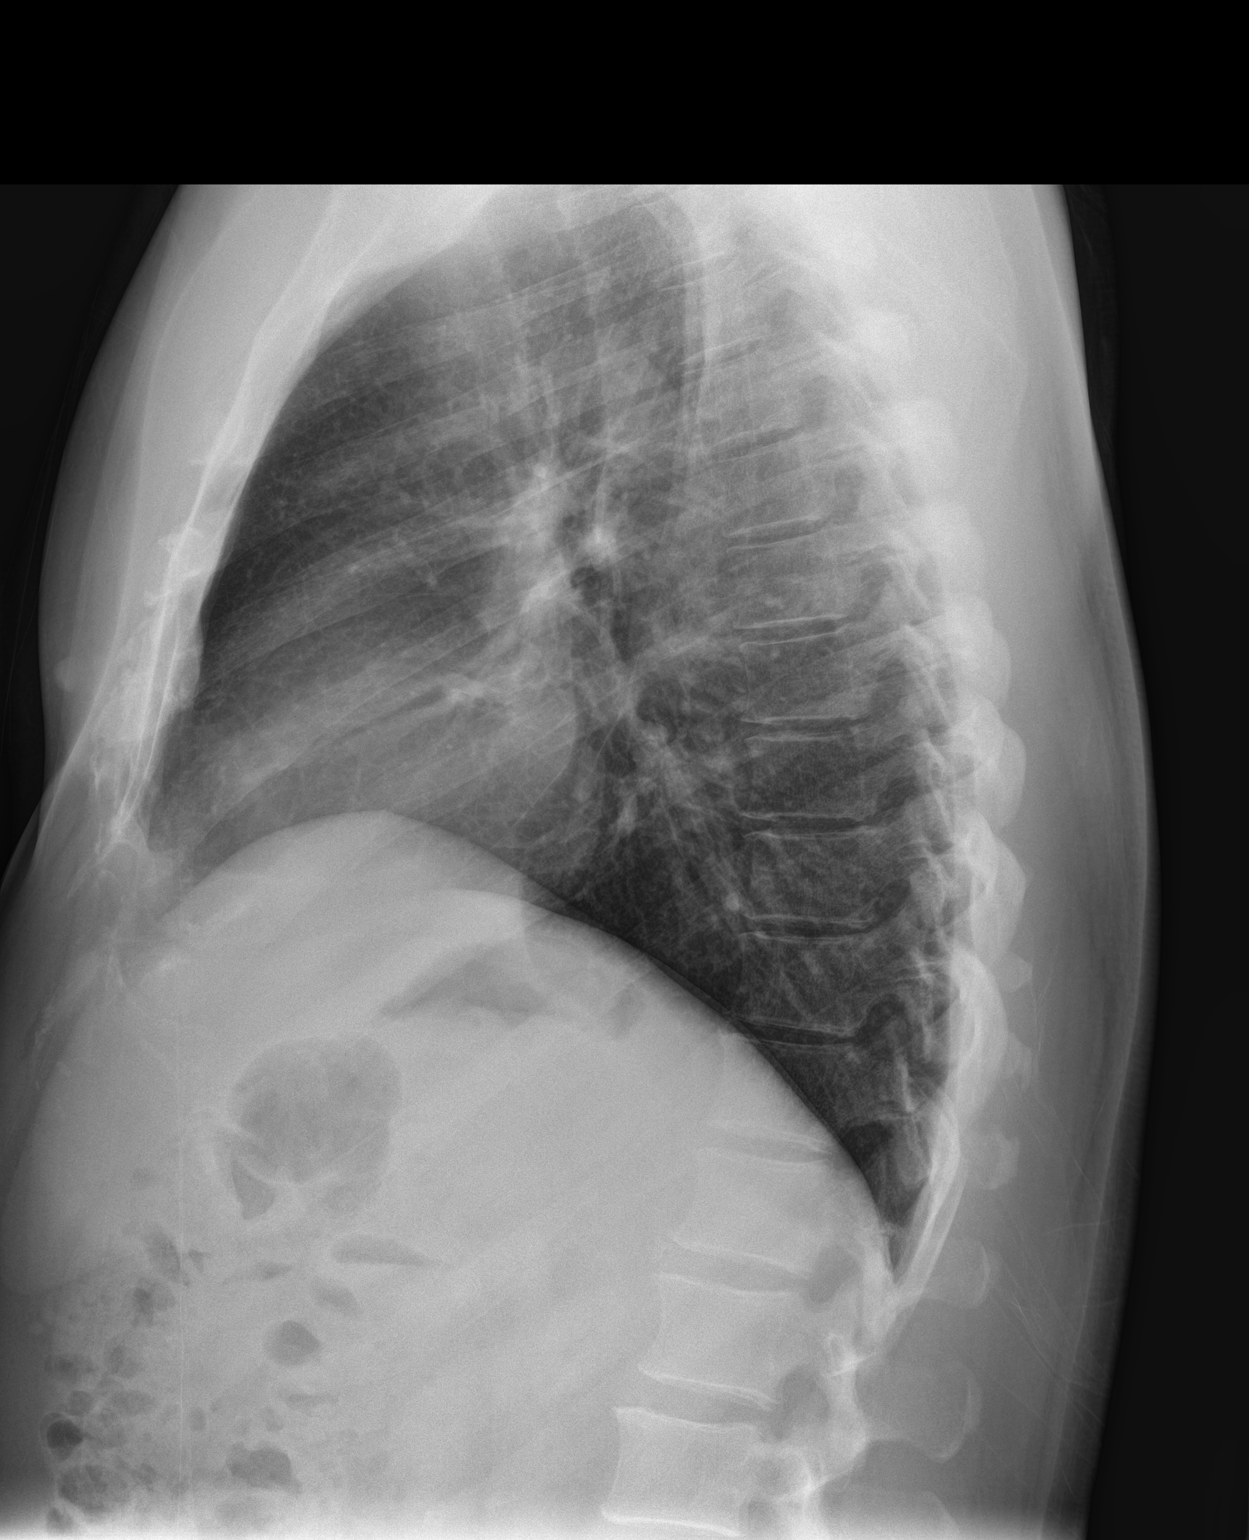

[chest pa (2 of 2)]
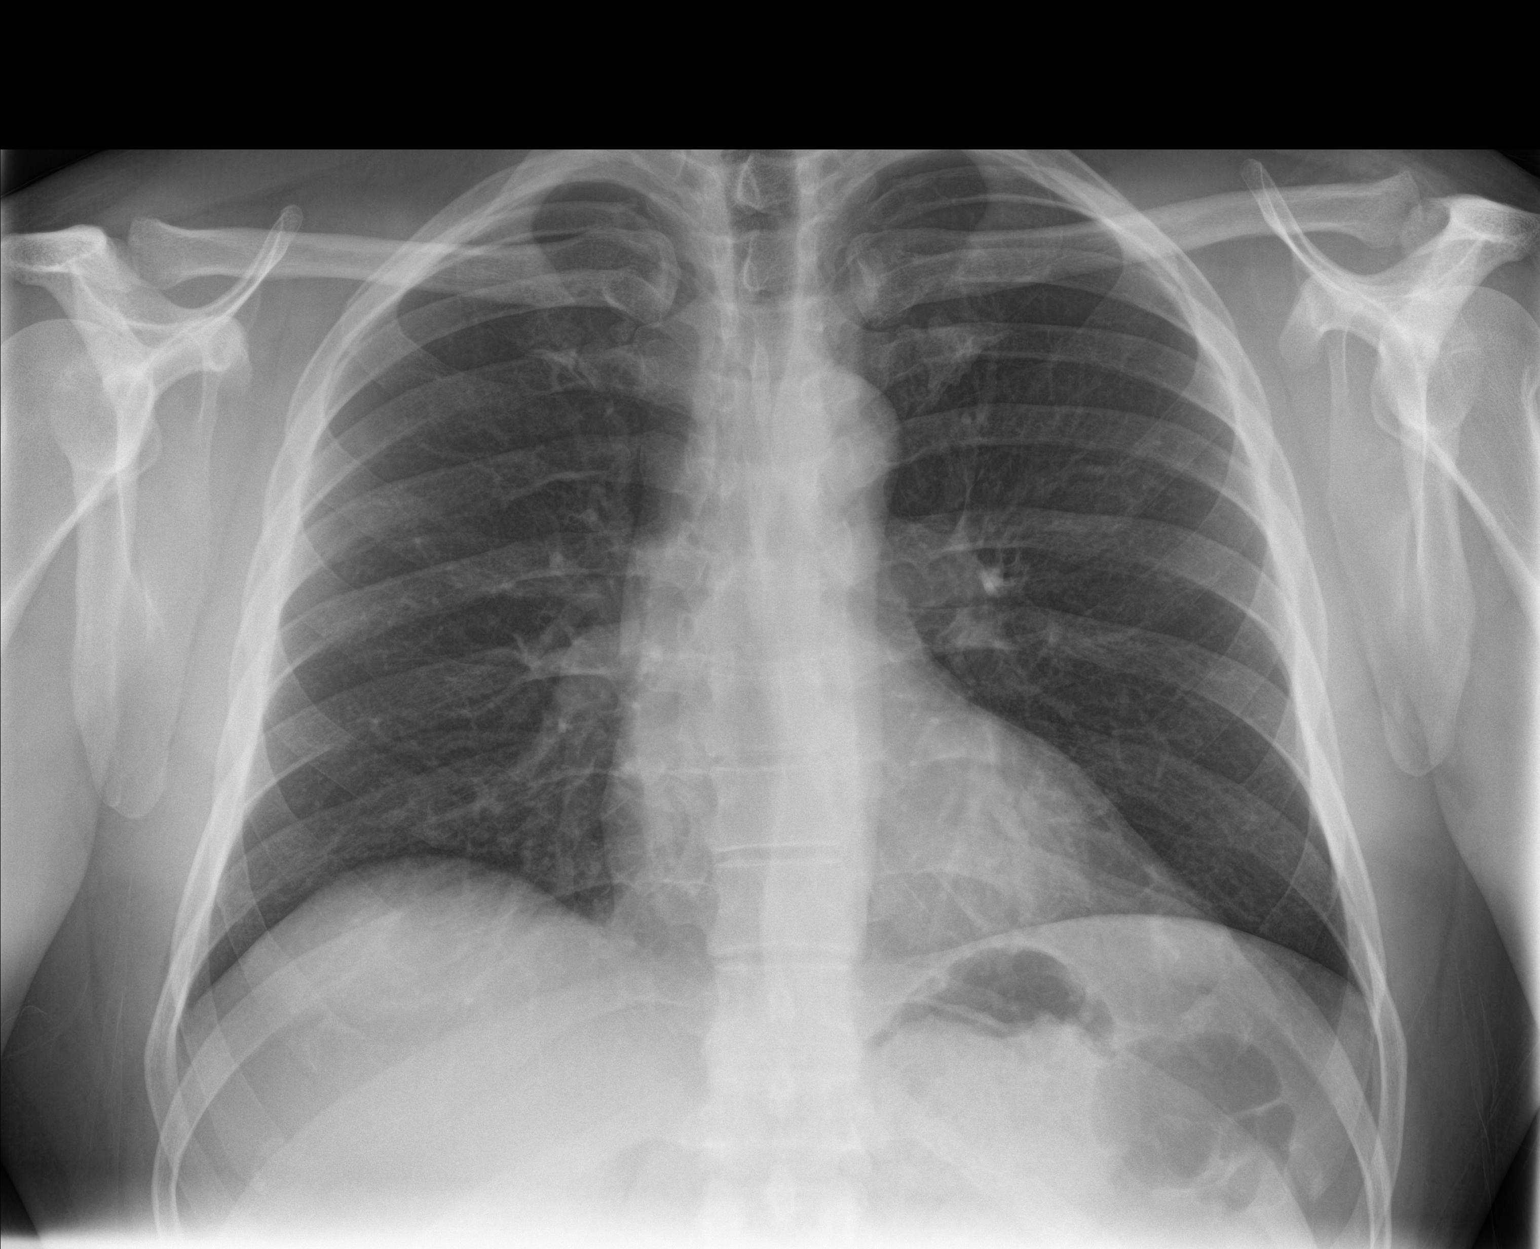

[3 of 3 positions shown; findings below may reference images not displayed]

FINDINGS: The heart size and mediastinal contours are within normal limits.
Both lungs are clear. The visualized skeletal structures are
unremarkable.
IMPRESSION: No active cardiopulmonary disease.

## 2023-02-18 ENCOUNTER — Ambulatory Visit
Admission: EM | Admit: 2023-02-18 | Discharge: 2023-02-18 | Disposition: A | Discharge status: Home / Self Care | Payer: BC Managed Care – PPO

## 2023-02-18 ENCOUNTER — Encounter: Payer: Self-pay | Admitting: Emergency Medicine

## 2023-02-18 DIAGNOSIS — U071 COVID-19: Secondary | ICD-10-CM | POA: Diagnosis not present

## 2023-02-18 MED ORDER — NIRMATRELVIR/RITONAVIR (PAXLOVID)TABLET: 3.0000 | ORAL_TABLET | Freq: Two times a day (BID) | ORAL | 0 refills | Status: AC

## 2023-02-18 MED ORDER — PROMETHAZINE-DM 6.25-15 MG/5ML PO SYRP: 5.0000 mL | ORAL_SOLUTION | Freq: Four times a day (QID) | ORAL | 0 refills | Status: DC | PRN

## 2023-02-18 MED ORDER — IPRATROPIUM BROMIDE 0.06 % NA SOLN: 2.0000 | Freq: Four times a day (QID) | NASAL | 12 refills | Status: DC

## 2023-02-18 MED ORDER — BENZONATATE 100 MG PO CAPS: 200.0000 mg | ORAL_CAPSULE | Freq: Three times a day (TID) | ORAL | 0 refills | Status: DC

## 2023-02-18 NOTE — Discharge Instructions (Signed)
CDC guidelines state that you must wear a mask for the first 5 days of symptoms when you are around other people.  After 5 days you no longer need to mask as you are no longer considered infectious.  There is no longer need to quarantine unless you have a fever.  If you do have a fever then you need to quarantine until you have been fever free for 24 hours without taking Tylenol and/or ibuprofen.  Use over-the-counter Tylenol and/or ibuprofen according to the package instructions as needed for fever and pain.  Use the Atrovent nasal spray, 2 squirts up each nostril every 6 hours, as needed for nasal congestion and runny nose.  Use the Tessalon Perles every 8 hours during the day as needed for cough.  Take them with a small sip of water.  You may experience numbness to the base of your tongue or metallic taste in her mouth, this is normal.  Use the Promethazine DM cough syrup at bedtime as needed for cough and congestion.  Be mindful this medication will make you sleepy.  If you have been prescribed Paxlovid take it twice daily as prescribed for 5 days. Do not take your Lipitor and Tricor while taking Paxlovid. You can resume them both 24 hours after you complete therapy.  If you develop any worsening respiratory symptoms such as shortness of breath, shortness of breath at rest, feel as though you cannot catch your breath, you are unable to speak in full sentences, or, as a late sign, your lips begin turning blue you need to call 911 and go to the ER for evaluation.

## 2023-02-18 NOTE — ED Provider Notes (Addendum)
[facility]    CSN: [id number] Arrival date & time: 02/18/23  1135      History   Chief Complaint Chief Complaint  Patient presents with   Covid Positive    HPI Jared Riley is a 47 y.o. male.   HPI  47 year old male with a past medical history significant for hypertension and hypercholesterolemia presents for evaluation after testing COVID-positive last night.  His symptoms began 2 days ago and they started with fever up to 101, sore throat, runny nose, nasal congestion, nonproductive cough, and brain fog.  He did recently travel to [place].  Patient is interested in Paxlovid.  Past Medical History:  Diagnosis Date   Hypercholesteremia    Hypertension     There are no problems to display for this patient.   Past Surgical History:  Procedure Laterality Date   HERNIA REPAIR     TENDON REPAIR Right 06/08/2021       Home Medications    Prior to Admission medications   Medication Sig Start Date End Date Taking? Authorizing Provider  atorvastatin (LIPITOR) 40 MG tablet Take 40 mg by mouth daily.   Yes [provider]  benzonatate (TESSALON) 100 MG capsule Take 2 capsules (200 mg total) by mouth every 8 (eight) hours. 02/18/23  Yes Becky Augusta, NP  ezetimibe (ZETIA) 10 MG tablet Take 10 mg by mouth daily.   Yes [provider]  fenofibrate (TRICOR) 48 MG tablet Take 48 mg by mouth daily.   Yes [provider]  ipratropium (ATROVENT) 0.06 % nasal spray Place 2 sprays into both nostrils 4 (four) times daily. 02/18/23  Yes Becky Augusta, NP  nirmatrelvir/ritonavir (PAXLOVID) 20 x 150 MG & 10 x 100MG  TABS Take 3 tablets by mouth 2 (two) times daily for 5 days. 02/18/23 02/23/23 Yes Becky Augusta, NP  promethazine-dextromethorphan (PROMETHAZINE-DM) 6.25-15 MG/5ML syrup Take 5 mLs by mouth 4 (four) times daily as needed. 02/18/23  Yes Becky Augusta, NP  fluticasone (FLONASE) 50 MCG/ACT nasal spray Place 2 sprays into both nostrils daily.  11/13/17 06/28/20  Domenick Gong, MD    Family History No family history on file.  Social History Social History   Tobacco Use   Smoking status: Never   Smokeless tobacco: Never  Vaping Use   Vaping status: Never Used  Substance Use Topics   Alcohol use: Yes    Comment: social   Drug use: No     Allergies   Erythromycin   Review of Systems Review of Systems  Constitutional:  Positive for fever.  HENT:  Positive for congestion, rhinorrhea and sore throat. Negative for ear pain.   Respiratory:  Positive for cough and shortness of breath. Negative for wheezing.   Psychiatric/Behavioral:  Positive for decreased concentration.      Physical Exam Triage Vital Signs ED Triage Vitals  Encounter Vitals Group     BP 02/18/23 1158 (!) 138/100     Systolic BP Percentile --      Diastolic BP Percentile --      Pulse Rate 02/18/23 1158 88     Resp 02/18/23 1158 18     Temp 02/18/23 1158 98.4 F (36.9 C)     Temp Source 02/18/23 1158 Oral     SpO2 02/18/23 1158 97 %     Weight 02/18/23 1156 175 lb 0.7 oz (79.4 kg)     Height 02/18/23 1156 5\' 7"  (1.702 m)     Head Circumference --      Peak  Flow --      Pain Score 02/18/23 1155 0     Pain Loc --      Pain Education --      Exclude from Growth Chart --    No data found.  Updated Vital Signs BP (!) 138/100 (BP Location: Right Arm)   Pulse 88   Temp 98.4 F (36.9 C) (Oral)   Resp 18   Ht 5\' 7"  (1.702 m)   Wt 175 lb 0.7 oz (79.4 kg)   SpO2 97%   BMI 27.42 kg/m   Visual Acuity Right Eye Distance:   Left Eye Distance:   Bilateral Distance:    Right Eye Near:   Left Eye Near:    Bilateral Near:     Physical Exam Vitals and nursing note reviewed.  Constitutional:      Appearance: Normal appearance. He is not ill-appearing.  HENT:     Head: Normocephalic and atraumatic.     Right Ear: Tympanic membrane, ear canal and external ear normal. There is no impacted cerumen.     Left Ear: Tympanic membrane, ear  canal and external ear normal. There is no impacted cerumen.     Nose: Congestion and rhinorrhea present.     Comments: Nasal mucosa is erythematous and edematous with scant clear discharge in both nares.    Mouth/Throat:     Mouth: Mucous membranes are moist.     Pharynx: Oropharynx is clear. Posterior oropharyngeal erythema present. No oropharyngeal exudate.     Comments: Mild erythema to the posterior oropharynx with clear postnasal drip. Cardiovascular:     Rate and Rhythm: Normal rate and regular rhythm.     Pulses: Normal pulses.     Heart sounds: Normal heart sounds. No murmur heard.    No friction rub. No gallop.  Pulmonary:     Effort: Pulmonary effort is normal.     Breath sounds: Normal breath sounds. No wheezing, rhonchi or rales.  Musculoskeletal:     Cervical back: Normal range of motion and neck supple. No tenderness.  Lymphadenopathy:     Cervical: No cervical adenopathy.  Skin:    General: Skin is warm and dry.     Capillary Refill: Capillary refill takes less than 2 seconds.     Findings: No rash.  Neurological:     General: No focal deficit present.     Mental Status: He is alert and oriented to person, place, and time.      UC Treatments / Results  Labs (all labs ordered are listed, but only abnormal results are displayed) Labs Reviewed - No data to display  EKG   Radiology No results found.  Procedures Procedures (including critical care time)  Medications Ordered in UC Medications - No data to display  Initial Impression / Assessment and Plan / UC Course  I have reviewed the triage vital signs and the nursing notes.  Pertinent labs & imaging results that were available during my care of the patient were reviewed by me and considered in my medical decision making (see chart for details).   Patient is a nontoxic-appearing 47 year old male presenting after testing COVID-positive to discuss Paxlovid.  His symptoms have been going on for the past 2  days and he tested positive last night.  He did recently have a travel trip to [place].  He does endorse shortness of breath but he is able to speak in full sentence without dyspnea or tachypnea.  He has recent blood work in [employer] [facility]  from 07/01/2022 which shows a GFR of 83.  I will start him on Paxlovid twice daily for 5 days as well as prescribe Atrovent nasal spray to help with the nasal congestion and postnasal drip.  Tessalon Perles and Promethazine DM cough syrup to help with cough and congestion.  We discussed that there is no need to quarantine unless febrile.  Since he has been running a fever he needs to be 24 hours fever free without taking Tylenol and or ibuprofen.  Otherwise he needs to wear a mask around others for the first 5 days of symptoms.  We also discussed ER and return precautions.  Work note provided.  I also advised the patient to hold his Lipitor and Tricor until after he has completed Paxlovid.  He may resume in 24 hours after he completes antiviral therapy.   Final Clinical Impressions(s) / UC Diagnoses   Final diagnoses:  COVID-19     Discharge Instructions      CDC guidelines state that you must wear a mask for the first 5 days of symptoms when you are around other people.  After 5 days you no longer need to mask as you are no longer considered infectious.  There is no longer need to quarantine unless you have a fever.  If you do have a fever then you need to quarantine until you have been fever free for 24 hours without taking Tylenol and/or ibuprofen.  Use over-the-counter Tylenol and/or ibuprofen according to the package instructions as needed for fever and pain.  Use the Atrovent nasal spray, 2 squirts up each nostril every 6 hours, as needed for nasal congestion and runny nose.  Use the Tessalon Perles every 8 hours during the day as needed for cough.  Take them with a small sip of water.  You may experience numbness to the base of your tongue or metallic taste in  her mouth, this is normal.  Use the Promethazine DM cough syrup at bedtime as needed for cough and congestion.  Be mindful this medication will make you sleepy.  If you have been prescribed Paxlovid take it twice daily as prescribed for 5 days. Do not take your Lipitor and Tricor while taking Paxlovid. You can resume them both 24 hours after you complete therapy.  If you develop any worsening respiratory symptoms such as shortness of breath, shortness of breath at rest, feel as though you cannot catch your breath, you are unable to speak in full sentences, or, as a late sign, your lips begin turning blue you need to call 911 and go to the ER for evaluation.      ED Prescriptions     Medication Sig Dispense Auth. Provider   nirmatrelvir/ritonavir (PAXLOVID) 20 x 150 MG & 10 x 100MG  TABS Take 3 tablets by mouth 2 (two) times daily for 5 days. 30 tablet Becky Augusta, NP   benzonatate (TESSALON) 100 MG capsule Take 2 capsules (200 mg total) by mouth every 8 (eight) hours. 21 capsule Becky Augusta, NP   ipratropium (ATROVENT) 0.06 % nasal spray Place 2 sprays into both nostrils 4 (four) times daily. 15 mL Becky Augusta, NP   promethazine-dextromethorphan (PROMETHAZINE-DM) 6.25-15 MG/5ML syrup Take 5 mLs by mouth 4 (four) times daily as needed. 118 mL Becky Augusta, NP      PDMP not reviewed this encounter.   Becky Augusta, NP 02/18/23 1212    Becky Augusta, NP 02/18/23 1212

## 2023-02-18 NOTE — ED Triage Notes (Addendum)
Pt c/o cough, sore throat, fever (101), nasal congestion, and brain fog. Started about 2 days ago. He states recently traveled to [place]. Home covid test was positive. Pt is interested in Paxlovid

## 2023-08-01 DIAGNOSIS — Z8719 Personal history of other diseases of the digestive system: Secondary | ICD-10-CM | POA: Insufficient documentation

## 2023-09-22 DIAGNOSIS — M5412 Radiculopathy, cervical region: Secondary | ICD-10-CM | POA: Insufficient documentation

## 2024-04-29 DIAGNOSIS — M5416 Radiculopathy, lumbar region: Secondary | ICD-10-CM | POA: Insufficient documentation

## 2024-08-10 ENCOUNTER — Emergency Department

## 2024-08-10 ENCOUNTER — Emergency Department: Admission: EM | Admit: 2024-08-10 | Discharge: 2024-08-10 | Disposition: A | Discharge status: Home / Self Care

## 2024-08-10 ENCOUNTER — Other Ambulatory Visit: Payer: Self-pay

## 2024-08-10 DIAGNOSIS — R202 Paresthesia of skin: Secondary | ICD-10-CM | POA: Diagnosis not present

## 2024-08-10 DIAGNOSIS — Y9352 Activity, horseback riding: Secondary | ICD-10-CM | POA: Diagnosis not present

## 2024-08-10 DIAGNOSIS — I10 Essential (primary) hypertension: Secondary | ICD-10-CM | POA: Insufficient documentation

## 2024-08-10 DIAGNOSIS — M542 Cervicalgia: Secondary | ICD-10-CM | POA: Insufficient documentation

## 2024-08-10 LAB — PROTIME-INR
INR: 0.9 (ref 0.8–1.2)
Prothrombin Time: 12.7 s (ref 11.4–15.2)

## 2024-08-10 LAB — CBC WITH DIFFERENTIAL/PLATELET
Abs Immature Granulocytes: 0.02 K/uL (ref 0.00–0.07)
Basophils Absolute: 0.1 K/uL (ref 0.0–0.1)
Basophils Relative: 1 %
Eosinophils Absolute: 0.1 K/uL (ref 0.0–0.5)
Eosinophils Relative: 1 %
HCT: 41.3 % (ref 39.0–52.0)
Hemoglobin: 13.9 g/dL (ref 13.0–17.0)
Immature Granulocytes: 0 %
Lymphocytes Relative: 29 %
Lymphs Abs: 2.5 K/uL (ref 0.7–4.0)
MCH: 29.3 pg (ref 26.0–34.0)
MCHC: 33.7 g/dL (ref 30.0–36.0)
MCV: 87.1 fL (ref 80.0–100.0)
Monocytes Absolute: 0.6 K/uL (ref 0.1–1.0)
Monocytes Relative: 7 %
Neutro Abs: 5.3 K/uL (ref 1.7–7.7)
Neutrophils Relative %: 62 %
Platelets: 227 K/uL (ref 150–400)
RBC: 4.74 MIL/uL (ref 4.22–5.81)
RDW: 12 % (ref 11.5–15.5)
WBC: 8.5 K/uL (ref 4.0–10.5)
nRBC: 0 % (ref 0.0–0.2)

## 2024-08-10 LAB — BASIC METABOLIC PANEL WITH GFR
Anion gap: 12 (ref 5–15)
BUN: 23 mg/dL — ABNORMAL HIGH (ref 6–20)
CO2: 26 mmol/L (ref 22–32)
Calcium: 9.6 mg/dL (ref 8.9–10.3)
Chloride: 104 mmol/L (ref 98–111)
Creatinine, Ser: 0.96 mg/dL (ref 0.61–1.24)
GFR, Estimated: >60 mL/min
Glucose, Bld: 93 mg/dL (ref 70–99)
Potassium: 3.8 mmol/L (ref 3.5–5.1)
Sodium: 142 mmol/L (ref 135–145)

## 2024-08-10 NOTE — Discharge Instructions (Addendum)
 Your evaluation in the emergency department was overall reassuring.  As discussed, an MRI did show small nonspecific fluid collection which could be indicative of a tear of one of the ligaments in your neck near your spine.  I discussed your case with the neurosurgeon who reviewed the images.  They did not see an obvious tear but do recommend that you wear the cervical collar when out of bed over the next 2 weeks they would like to see you in about 2 weeks to reevaluate you and likely get repeat imaging.  I provided you Dr. Elliot clinic number above--please call them to schedule appointment in about 2 weeks.  You can use Tylenol or Motrin  as needed for any recurrent discomfort.  Regarding the tingling sensation in your left arm, I do suspect you have a pinched nerve, but your spinal cord appears normal today.  This will hopefully improve over the next several days.  Return to the emergency department with any new or worsening symptoms.

## 2024-08-10 NOTE — ED Notes (Signed)
 Patient transported to MRI

## 2024-08-10 NOTE — ED Triage Notes (Addendum)
 Pt arrives via [facility] from [facility]. Pt was thrown over a horses head at 1400 today and was pushed about 2-3 feet while down. chin was tucked towards chest during fall. Helmet was worn during ride. No LOC. Pt states numbness and tingling in L arm that was worse after incident than it is now. Pt arrives in c collar and is ambulatory on arrival.

## 2024-08-10 NOTE — ED Provider Notes (Signed)
 "  [facility] Provider Note    Event Date/Time   First MD Initiated Contact with Patient 08/10/24 1825     (approximate)   History   Neck Injury  Pt arrives via [facility] from [facility]. Pt was thrown over a horses head at 1400 today and was pushed about 2-3 feet while down. chin was tucked towards chest during fall. Helmet was worn during ride. No LOC. Pt states numbness and tingling in L arm that was worse after incident than it is now. Pt arrives in c collar and is ambulatory on arrival.   HPI Jared Riley is a 49 y.o. male PMH hypertension, hyperlipidemia presents for evaluation of neck injury - Patient was riding a horse around 2 PM, horse tripped and fell forward, his legs were incidental but he fell forward with the horse and was pushed about 2 to 3 feet on the ground.  Was wearing helmet.  Chin was tucked toward his chest during the fall.  Denies LOC.  Not on blood thinners.  Did experience bilateral arm paresthesias after the event that he says is notably improved but does have some persistent paresthesia running down the medial aspect of his left arm extending to his pinky.  Denies any weakness.  Does also have mild paresthesias from his right elbow to wrist, no pain in either arm.  Rode 4 miles on his horse after this incident. -Has otherwise been in his usual state of health -Went to an orthopedic urgent care, x-rays performed, they sent patient to ED for further eval with apparent concern for possible underlying fracture -No saddle anesthesia, lower extremity weakness or numbness, urinary or fecal incontinence or retention     Physical Exam   Triage Vital Signs: BP 139/83   Pulse 62   Temp 97.9 F (36.6 C) (Oral)   Resp 17   Ht 5' 7 (1.702 m)   Wt 72.6 kg   SpO2 97%   BMI 25.06 kg/m     Most recent vital signs: Vitals:   08/10/24 2149 08/10/24 2259  BP: 139/83   Pulse: 62   Resp: 17   Temp:  97.9 F (36.6 C)  SpO2: 97%       General: Awake, no distress.  HEENT: Normocephalic, atraumatic, c-collar in place though no apparent tenderness to palpation in the midline neck and no clear step-off CV:  Good peripheral perfusion. RRR, RP 2+ Resp:  Normal effort. CTAB Abd:  No distention. Nontender to deep palpation throughout Other:  BUE full range of motion at all joints, no deformities.  Neurovascularly intact of bilateral upper extremities including radian/median/ulnar motor nerves.  Grip 5/5 bilateral upper extremities.  Paresthesias in C8 distribution.   ED Results / Procedures / Treatments   Labs (all labs ordered are listed, but only abnormal results are displayed) Labs Reviewed  BASIC METABOLIC PANEL WITH GFR - Abnormal; Notable for the following components:      Result Value   BUN 23 (*)    All other components within normal limits  CBC WITH DIFFERENTIAL/PLATELET  PROTIME-INR     EKG  N/a   RADIOLOGY Radiology interpreted by myself radiology reports reviewed.  No clear acute pathology identified.  Small nonspecific fluid collection on MRI C-spine reportedly nonspecific but could suggest anterior longitudinal ligament tear.    PROCEDURES:  Critical Care performed: No  Procedures   MEDICATIONS ORDERED IN ED: Medications - No data to display   IMPRESSION / MDM / ASSESSMENT AND PLAN /  ED COURSE  I reviewed the triage vital signs and the nursing notes.                              DDX/MDM/AP: Differential diagnosis includes, but is not limited to, C-spine fracture, consider vertebral dislocation, consider nerve impingement, fortunately symptoms are improving and only mild at this time--less likely spinal cord involvement.  Also consider intracranial hemorrhage, doubt skull fracture.  No evidence of other traumatic injuries at this time.  Plan: - Maintain c-collar in place - CT head, CT C-spine - Basic labs - N.p.o. - Denies need for pain control  Patient's presentation is most  consistent with acute presentation with potential threat to life or bodily function.   ED course below.  Workup overall unremarkable.  CT head and neck unremarkable.  Does have some persistent left arm paresthesia, escalated to MRI.  No spinal cord pathology identified though small fluid collection reportedly nonspecific per radiology but could reflect anterior longitudinal ligament tear.  Discussed with neurosurgery who reviewed imagings, no obvious tear identified but did recommend c-collar for 2 weeks when out of bed and they will follow patient in clinic for in about 2 weeks for reevaluation.  Suspect cervical radiculopathy as etiology of mild paresthesias of left arm.  ED return precautions in place.  Patient discharged home and agrees with plan.  Clinical Course as of 08/10/24 2319  Sat Aug 10, 2024  1919 CTH: IMPRESSION: 1. No acute intracranial abnormality   [MM]  1919 CTCSpine: IMPRESSION: 1. No significant abnormality   [MM]  2230 MRI C spine: IMPRESSION: 1. Small amount of prevertebral fluid at the C4-C6 levels, measuring 2 mm in thickness, of indeterminate origin. This raises suspicion for a small nondisplaced tear of the anterior longitudinal ligament at the C4-5 or C5-6 level 2. 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   [MM]  2233 Paging nsgy to discuss [MM]  2235 D/w Dr. Clois of NSGY - will review MRI and provide recs [MM]  2309 Dr. Clois of NSGY reviewed images, do recommend patient remain in the collar while out of bed for the next 2 weeks and they would like to see him in clinic in about 2 weeks, plan for flex extend x-rays at that time  Patient reassessed, remains very well-appearing here.  Does have some persistent mild paresthesias in his left arm.  Discussed MRI findings and recommendation for collar when out of bed.   Plan for NSGY outpt f/u in 2 weeks.  ED return precautions in place.  Patient agrees with plan. [MM]    Clinical  Course User Index [MM] Clarine Ozell LABOR, MD     FINAL CLINICAL IMPRESSION(S) / ED DIAGNOSES   Final diagnoses:  Fall from horse, initial encounter  Neck pain  Paresthesia     Rx / DC Orders   ED Discharge Orders     None        Note:  This document was prepared using Dragon voice recognition software and may include unintentional dictation errors.   Clarine Ozell LABOR, MD 08/10/24 [phone]  "

## 2024-08-13 ENCOUNTER — Other Ambulatory Visit: Payer: Self-pay | Admitting: Neurosurgery

## 2024-08-13 DIAGNOSIS — G959 Disease of spinal cord, unspecified: Secondary | ICD-10-CM

## 2024-08-14 NOTE — Progress Notes (Unsigned)
 "   Referring Physician:  No referring provider defined for this encounter.  Primary Physician:  Pcp, No  History of Present Illness: 08/14/2024 [name] is here today with a chief complaint of ***  Neck pain that cause left arm numbness tingling and weakness.  Has a C collar  Duration: ***08/10/2024 fell off a horse  Bowel/Bladder Dysfunction: none  Conservative measures:  Physical therapy: *** Has been doing PT for neck pain with [facility] before the accident.  Multimodal medical therapy including regular antiinflammatories: *** Celebrex, Methocarbamol , Meloxicam, Gabapentin, Medrol Dose pack, Naproxen, Flexeril Injections: no epidural steroid injections  Past Surgery: ***none  Jared Riley has ***no symptoms of cervical myelopathy.  The symptoms are causing a significant impact on the patient's life.   I have utilized the care everywhere function in epic to review the outside records available from external health systems.  Review of Systems:  A 10 point review of systems is negative, except for the pertinent positives and negatives detailed in the HPI.  Past Medical History: Past Medical History:  Diagnosis Date   Hypercholesteremia    Hypertension     Past Surgical History: Past Surgical History:  Procedure Laterality Date   HERNIA REPAIR     TENDON REPAIR Right 06/08/2021    Allergies: Allergies as of 08/26/2024 - Review Complete 08/10/2024  Allergen Reaction Noted   Erythromycin Other (See Comments) 06/24/2016    Medications: Current Medications[1]  Social History: Social History[2]  Family Medical History: No family history on file.  Physical Examination: There were no vitals filed for this visit.  General: Patient is in no apparent distress. Attention to examination is appropriate.  Neck:   Supple.  Full range of motion.  Respiratory: Patient is breathing without any difficulty.   NEUROLOGICAL:     Awake, alert, oriented to  person, place, and time.  Speech is clear and fluent.   Cranial Nerves: Pupils equal round and reactive to light.  Facial tone is symmetric.  Facial sensation is symmetric. Shoulder shrug is symmetric. Tongue protrusion is midline.    Strength: Side Biceps Triceps Deltoid Interossei Grip Wrist Ext. Wrist Flex.  R 5 5 5 5 5 5 5   L 5 5 5 5 5 5 5    Side Iliopsoas Quads Hamstring PF DF EHL  R 5 5 5 5 5 5   L 5 5 5 5 5 5    Reflexes are ***2+ and symmetric at the biceps, triceps, brachioradialis, patella and achilles.   Hoffman's is absent. Clonus is absent  Bilateral upper and lower extremity sensation is intact to light touch ***.     No evidence of dysmetria noted.  Gait is normal.    Imaging: *** I have personally reviewed the images and agree with the above interpretation.  Medical Decision Making/Assessment and Plan: [name] is a pleasant 49 y.o. male with ***  There are no diagnoses linked to this encounter.   Thank you for involving me in the care of this patient.    Penne MICAEL Sharps MD/MSCR Neurosurgery     [1]  Current Outpatient Medications:    atorvastatin (LIPITOR) 40 MG tablet, Take 40 mg by mouth daily., Disp: , Rfl:    benzonatate  (TESSALON ) 100 MG capsule, Take 2 capsules (200 mg total) by mouth every 8 (eight) hours., Disp: 21 capsule, Rfl: 0   ezetimibe (ZETIA) 10 MG tablet, Take 10 mg by mouth daily., Disp: , Rfl:    fenofibrate (TRICOR) 48 MG tablet, Take 48  mg by mouth daily., Disp: , Rfl:    ipratropium (ATROVENT ) 0.06 % nasal spray, Place 2 sprays into both nostrils 4 (four) times daily., Disp: 15 mL, Rfl: 12   promethazine -dextromethorphan (PROMETHAZINE -DM) 6.25-15 MG/5ML syrup, Take 5 mLs by mouth 4 (four) times daily as needed., Disp: 118 mL, Rfl: 0 [2]  Social History Tobacco Use   Smoking status: Never   Smokeless tobacco: Never  Vaping Use   Vaping status: Never Used  Substance Use Topics   Alcohol use: Not Currently    Comment: social    Drug use: No   "

## 2024-08-26 ENCOUNTER — Ambulatory Visit: Admitting: Neurosurgery

## 2024-08-26 ENCOUNTER — Other Ambulatory Visit

## 2024-08-28 ENCOUNTER — Ambulatory Visit

## 2024-08-28 DIAGNOSIS — G959 Disease of spinal cord, unspecified: Secondary | ICD-10-CM

## 2024-08-29 ENCOUNTER — Ambulatory Visit: Admitting: Physician Assistant

## 2024-08-29 ENCOUNTER — Other Ambulatory Visit

## 2024-08-30 ENCOUNTER — Ambulatory Visit: Admitting: Family Medicine

## 2024-08-30 ENCOUNTER — Ambulatory Visit: Admitting: Neurosurgery

## 2024-08-30 ENCOUNTER — Encounter: Payer: Self-pay | Admitting: Family Medicine

## 2024-08-30 ENCOUNTER — Encounter: Payer: Self-pay | Admitting: Neurosurgery

## 2024-08-30 VITALS — BP 128/68 | HR 72 | Temp 98.4°F | Ht 67.0 in | Wt 153.4 lb

## 2024-08-30 VITALS — BP 112/62 | Ht 67.0 in | Wt 151.2 lb

## 2024-08-30 DIAGNOSIS — Z8719 Personal history of other diseases of the digestive system: Secondary | ICD-10-CM

## 2024-08-30 DIAGNOSIS — R399 Unspecified symptoms and signs involving the genitourinary system: Secondary | ICD-10-CM

## 2024-08-30 DIAGNOSIS — Z130 Encounter for screening for diseases of the blood and blood-forming organs and certain disorders involving the immune mechanism: Secondary | ICD-10-CM

## 2024-08-30 DIAGNOSIS — S134XXA Sprain of ligaments of cervical spine, initial encounter: Secondary | ICD-10-CM | POA: Insufficient documentation

## 2024-08-30 DIAGNOSIS — E7849 Other hyperlipidemia: Secondary | ICD-10-CM

## 2024-08-30 DIAGNOSIS — K219 Gastro-esophageal reflux disease without esophagitis: Secondary | ICD-10-CM

## 2024-08-30 DIAGNOSIS — M4802 Spinal stenosis, cervical region: Secondary | ICD-10-CM

## 2024-08-30 DIAGNOSIS — G5621 Lesion of ulnar nerve, right upper limb: Secondary | ICD-10-CM | POA: Insufficient documentation

## 2024-08-30 DIAGNOSIS — I7300 Raynaud's syndrome without gangrene: Secondary | ICD-10-CM

## 2024-08-30 DIAGNOSIS — Z1159 Encounter for screening for other viral diseases: Secondary | ICD-10-CM

## 2024-08-30 DIAGNOSIS — Z13220 Encounter for screening for lipoid disorders: Secondary | ICD-10-CM

## 2024-08-30 DIAGNOSIS — M5412 Radiculopathy, cervical region: Secondary | ICD-10-CM | POA: Insufficient documentation

## 2024-08-30 DIAGNOSIS — Z125 Encounter for screening for malignant neoplasm of prostate: Secondary | ICD-10-CM

## 2024-08-30 DIAGNOSIS — M2240 Chondromalacia patellae, unspecified knee: Secondary | ICD-10-CM | POA: Insufficient documentation

## 2024-08-30 DIAGNOSIS — Z131 Encounter for screening for diabetes mellitus: Secondary | ICD-10-CM

## 2024-08-30 DIAGNOSIS — R6882 Decreased libido: Secondary | ICD-10-CM

## 2024-08-30 MED ORDER — METHYLPREDNISOLONE 4 MG PO TBPK: ORAL_TABLET | ORAL | 0 refills | Status: AC

## 2024-08-30 NOTE — Progress Notes (Signed)
 "   Referring Physician:  No referring provider defined for this encounter.  Primary Physician:  Pcp, No  Discussed the use of AI scribe software for clinical note transcription with the patient, who gave verbal consent to proceed.  History of Present Illness Jared Riley is a 49 year old male who presents for evaluation of neck pain and right upper extremity symptoms following a recent fall from a horse.  He fell from a horse at about 10-12 miles per hour when the horse tripped over a log. He landed with his head and chin flexed to his chest, impacting his helmet and nose, then came to rest against a tree [place]. He could not break the fall with his hands. Since then he has had constant neck pain rated 3-4/10 with tightness and reduced range of motion, worse with turning to the right. He previously had chronic upper shoulder and back tightness but no significant neck pain. He denies head trauma beyond impact to the helmet and nose.  He has new upper extremity symptoms with pain radiating from the elbow to the hand on the right side. This comes from the elbow and involves the medial hand. On the left side it involves his arm shoulder and mediolateral hand.  Symptoms are worsened by sleeping with the arm bent, reading, and phone use on the right. He denies significant weakness and remains able to care for his horse and lift hay bales, though he moves more cautiously.  He completed a prior course of physical therapy for upper neck and shoulder issues. Since the fall he has not used steroids but is interested in a steroid dose pack. He prefers conservative treatment because of a past postoperative ankle infection. He uses a small collar for neck support while riding and is looking for a lighter helmet, as heavier helmets increase neck strain during long rides. He denies current activity restriction and continues riding and daily tasks.  Bowel/Bladder Dysfunction: none  Conservative measures: ice,  heat Physical therapy:  Has been doing PT for neck pain with [facility] for neck and shoulder Multimodal medical therapy including regular antiinflammatories: Celebrex, Methocarbamol , Meloxicam, Gabapentin, Medrol  Dose pack, Naproxen, Flexeril, Ibuprofen  Injections: no epidural steroid injections  Past Surgery: no spine surgeries  The symptoms are causing a significant impact on the patient's life.   I have utilized the care everywhere function in epic to review the outside records available from external health systems.  Review of Systems:  A 10 point review of systems is negative, except for the pertinent positives and negatives detailed in the HPI.  Past Medical History: Past Medical History:  Diagnosis Date   Allergy    GERD (gastroesophageal reflux disease)    Hypercholesteremia    Hypertension     Past Surgical History: Past Surgical History:  Procedure Laterality Date   ESOPHAGUS SURGERY     fixed the flap of the esophagus per patient   EYE SURGERY     HERNIA REPAIR     TENDON REPAIR Right 06/08/2021    Allergies: Allergies as of 08/30/2024 - Review Complete 08/30/2024  Allergen Reaction Noted   Rosuvastatin Other (See Comments) 11/29/2012   Erythromycin Other (See Comments) 06/24/2016   Lisinopril Cough 02/05/2021    Medications: Current Medications[1]  Social History: Social History[2]  Family Medical History: Family History  Problem Relation Age of Onset   Alcohol abuse Mother    Cancer Mother    Hyperlipidemia Mother    Alcohol abuse Father    Hypertension  Father    Hyperlipidemia Father    ADD / ADHD Father    Hearing loss Father     Physical Examination: Vitals:   08/30/24 0958  BP: 112/62    General: Patient is in no apparent distress. Attention to examination is appropriate.  Neck:   Supple.  Full range of motion.  Respiratory: Patient is breathing without any difficulty.   NEUROLOGICAL:  Physical Exam GENERAL: NAD, pleasant,  cooperative MENTAL STATUS: Alert and oriented x3, Language is fluent with good comprehension CRANIAL NERVES: Pupils are equal, round, and reactive to light, Visual fields intact, Extra-ocular movements intact, Face symmetric with normal sensation, Hearing intact, Palate symmetric, No dysarthria, 5/5 strength in sternocleidomastoid and trapezius, Midline tongue protrusion MOTOR: Muscle bulk and tone are normal, Strength is 5/5 in upper and lower extremities, Intact fine motor movement without pronator drift, possible mild/trace ulnar weakness on the right REFLEXES: 2+ throughout right side, Decreased reflexes on left at biceps, BR and tricep SENSATION: Sensation is intact to light touch, pinprick, vibration, and proprioception throughout, slight decrease in left arm. COORDINATION: No dysmetria on finger-nose-finger or heel-knee-shin, Normal rapid alternating movements, Romberg negative STATION: Normal stance without truncal ataxia GAIT: Normal, Able to walk on heels, toes, and in tandem NECK: minimal pain with AROM, but some stiffness   Imaging: DG Cervical Spine Complete Result Date: 08/29/2024 EXAM: 6 VIEW(S) XRAY OF THE CERVICAL SPINE 08/28/2024 10:14:29 AM COMPARISON: CT and MRI 08/10/2024. CLINICAL HISTORY: possible clearance FINDINGS: BONES: Vertebral body heights are maintained. Alignment is normal. DISC SPACES/DEGENERATIVE CHANGES: Mild disc space height loss at C5-C6 and C6-C7. Mild facet arthropathy at C5-C6 and C6-C7. There is widening of the anterior disc space at C4-C5 on neutral view (6 mm). This widens with neck extension to 7 mm. In combination with the findings on MRI at this level, there is increased suspicion for small anterior longitudinal ligament tear. SOFT TISSUES: No prevertebral soft tissue swelling. The visualized lungs appear clear. IMPRESSION: 1. Widening of the anterior disc space at C4-C5 (6 mm neutral, 7 mm extension), raising suspicion for a small anterior longitudinal  ligament tear, especially in combination with MRI findings. Electronically signed by: Norman Gatlin MD 08/29/2024 10:18 PM EST RP Workstation: [id number]   MR Cervical Spine Wo Contrast Result Date: 08/10/2024 EXAM: MRI CERVICAL SPINE WITHOUT CONTRAST 08/10/2024 10:08:46 PM TECHNIQUE: Multiplanar multisequence MRI of the cervical spine was performed. COMPARISON: None available. CLINICAL HISTORY: Fall from horse, CT with no fracture, ongoing left upper extremity paresthesia --eval for spinal cord pathology. FINDINGS: BONES AND ALIGNMENT: Reversal of normal cervical lordosis. Normal vertebral body heights. Bone marrow signal is unremarkable. SPINAL CORD: Normal spinal cord size. No abnormal spinal cord signal. SOFT TISSUES: Small amount of prevertebral fluid at the C4-C6 levels, 2 mm in thickness. This raises suspicion for an occult tear of the anterior longitudinal ligament. No visibly displaced ligamentous injury. No paraspinal mass. C2-C3: Small left subarticular disc protrusion. Moderate left foraminal stenosis. No spinal canal stenosis. C3-C4: Small disc bulge with no spinal canal or neural foraminal stenosis. C4-C5: No disc herniation or stenosis. C5-C6: Intermediate-sized disc bulge with bilateral uncovertebral hypertrophy. No spinal canal stenosis. Severe right and moderate left neural foraminal stenosis. C6-C7: Small disc bulge with left greater than right uncovertebral hypertrophy. Moderate right and mild left neural foraminal stenosis. No spinal canal stenosis. C7-T1: No significant disc herniation. No spinal canal stenosis or neural foraminal narrowing. IMPRESSION: 1. Small amount of prevertebral fluid at the C4-C6 levels, measuring 2 mm in  thickness, of indeterminate origin. This raises suspicion for a small nondisplaced tear of the anterior longitudinal ligament at the C4-5 or C5-6 level 2. 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 Electronically signed by: Franky Stanford MD 08/10/2024  10:21 PM EST RP Workstation: [id number]   CT HEAD WO CONTRAST ( ) Result Date: 08/10/2024 EXAM: CT HEAD WITHOUT CONTRAST 08/10/2024 06:53:56 PM TECHNIQUE: CT of the head was performed without the administration of intravenous contrast. Automated exposure control, iterative reconstruction, and/or weight based adjustment of the mA/kV was utilized to reduce the radiation dose to as low as reasonably achievable. COMPARISON: None available. CLINICAL HISTORY: trauma, fell off horse FINDINGS: BRAIN AND VENTRICLES: Cavum septum pellucidum. No acute hemorrhage. No evidence of acute infarct. No hydrocephalus. No extra-axial collection. No mass effect or midline shift. ORBITS: No acute abnormality. SINUSES: No acute abnormality. SOFT TISSUES AND SKULL: No acute soft tissue abnormality. No skull fracture. IMPRESSION: 1. No acute intracranial abnormality Electronically signed by: Franky Stanford MD 08/10/2024 06:59 PM EST RP Workstation: [id number]   CT Cervical Spine Wo Contrast Result Date: 08/10/2024 EXAM: CT CERVICAL SPINE WITHOUT CONTRAST 08/10/2024 06:53:56 PM TECHNIQUE: CT of the cervical spine was performed without the administration of intravenous contrast. Multiplanar reformatted images are provided for review. Automated exposure control, iterative reconstruction, and/or weight based adjustment of the mA/kV was utilized to reduce the radiation dose to as low as reasonably achievable. COMPARISON: None available. CLINICAL HISTORY: Trauma, fell off horse Trauma, fell off horse FINDINGS: BONES AND ALIGNMENT: No acute fracture or traumatic malalignment. DEGENERATIVE CHANGES: No significant degenerative changes. SOFT TISSUES: No prevertebral soft tissue swelling. IMPRESSION: 1. No significant abnormality Electronically signed by: Franky Stanford MD 08/10/2024 06:58 PM EST RP Workstation: [id number]    I have personally reviewed the images and agree with the above interpretation.  There is some trace ventral edema on the  MRI of the cervical spine and some trace movement mostly with anterior osteophyte movement but no sagittal displacement or signs of frank instability.  He does have some baseline cervical stenosis especially in the foraminal level.  No other areas of concern.  CT head was negative.  Assessment & Plan Cervical radiculopathy/radiculitis with possible cervical ligamentous injury He sustained neck trauma resulting in mild cervical instability and ligamentous injury without evidence of florid instability or dislocation on imaging. Symptoms of cervical radiculopathy and chronic neck and upper back myofascial tightness have worsened since the fall. No significant weakness is present, but reflexes are decreased on the affected side. Conservative management is preferred due to prior surgical complications. Mild instability increases risk of further injury with trauma, necessitating monitoring for progression. - Ordered follow-up cervical spine radiographs in two months to assess for progression of instability. - Referred to pain management for consideration of spine injections  to address nerve root irritation and radicular symptoms. - Discussed conservative management, including physical therapy, which he has already completed for neck and shoulder symptoms. - Advised use of a cervical collar and lighter helmet for additional support and symptom management during riding. - Provided anticipatory guidance regarding increased risk of injury with further trauma due to mild instability. - Instructed him to report new or worsening weakness or neck pain promptly. - Prescribed a short course of oral corticosteroids (dose pack) to reduce nerve irritation and inflammation.  Right ulnar neuropathy He experiences intermittent right ulnar neuropathy with paresthesias from the elbow to the hand, exacerbated by elbow flexion. Exam and history are consistent with ulnar nerve irritation, likely worsened by recent trauma  and  chronic overuse. No significant weakness is present. - Discussed conservative management, including nocturnal use of an elbow brace to prevent excessive flexion and reduce symptoms. - Recommended protective elbow pads for symptomatic relief during daily activities. - Instructed him to monitor for symptom progression, particularly development of weakness, and to report if this occurs.  Penne MICAEL Sharps MD/MSCR Neurosurgery      [1]  Current Outpatient Medications:    atorvastatin (LIPITOR) 40 MG tablet, Take 40 mg by mouth daily., Disp: , Rfl:    ezetimibe (ZETIA) 10 MG tablet, Take 10 mg by mouth daily., Disp: , Rfl:    fenofibrate (TRICOR) 48 MG tablet, Take 48 mg by mouth daily., Disp: , Rfl:    methylPREDNISolone  (MEDROL  DOSEPAK) 4 MG TBPK tablet, Take as Directed, per package instructions, Disp: 1 each, Rfl: 0   Omega-3 1000 MG CAPS, Take 2 g by mouth., Disp: , Rfl:    tadalafil (CIALIS) 5 MG tablet, Take 5 mg by mouth daily as needed., Disp: , Rfl:  [2]  Social History Tobacco Use   Smoking status: Never   Smokeless tobacco: Never  Vaping Use   Vaping status: Never Used  Substance Use Topics   Alcohol use: Not Currently   Drug use: No   "

## 2024-08-30 NOTE — Progress Notes (Unsigned)
 "  New Patient Office Visit  Patient ID: Jared Riley, Male   DOB: 09/03/1975 49 y.o. MRN: [id number]  Chief Complaint  Patient presents with   Establish Care    Index fingers and middle fingers starts going pale and has been happening since 4 or 5 months. Wants to annual physical exam  Wants to blood work to check everything.    Subjective:     Jared Riley presents to establish care  HPI  Discussed the use of AI scribe software for clinical note transcription with the patient, who gave verbal consent to proceed.  History of Present Illness    Outpatient Encounter Medications as of 08/30/2024  Medication Sig   atorvastatin (LIPITOR) 40 MG tablet Take 40 mg by mouth daily.   ezetimibe (ZETIA) 10 MG tablet Take 10 mg by mouth daily.   fenofibrate (TRICOR) 48 MG tablet Take 48 mg by mouth daily.   methylPREDNISolone  (MEDROL  DOSEPAK) 4 MG TBPK tablet Take as Directed, per package instructions   Omega-3 1000 MG CAPS Take 2 g by mouth.   tadalafil (CIALIS) 5 MG tablet Take 5 mg by mouth daily as needed.   [DISCONTINUED] fluticasone  (FLONASE ) 50 MCG/ACT nasal spray Place 2 sprays into both nostrils daily.   No facility-administered encounter medications on file as of 08/30/2024.    Past Medical History:  Diagnosis Date   Allergy    GERD (gastroesophageal reflux disease)    Hypercholesteremia    Hypertension     Past Surgical History:  Procedure Laterality Date   ESOPHAGUS SURGERY     fixed the flap of the esophagus per patient   EYE SURGERY     HERNIA REPAIR     TENDON REPAIR Right 06/08/2021    Family History  Problem Relation Age of Onset   Alcohol abuse Mother    Cancer Mother    Hyperlipidemia Mother    Alcohol abuse Father    Hypertension Father    Hyperlipidemia Father    ADD / ADHD Father    Hearing loss Father     Social History   Socioeconomic History   Marital status: Married    Spouse name: Not on file   Number of children: Not on file    Years of education: Not on file   Highest education level: Bachelor's degree (e.g., BA, AB, BS)  Occupational History   Not on file  Tobacco Use   Smoking status: Never   Smokeless tobacco: Never  Vaping Use   Vaping status: Never Used  Substance and Sexual Activity   Alcohol use: Not Currently    Comment: Quit drinking last may 2025   Drug use: Never   Sexual activity: Yes    Birth control/protection: Surgical, None  Other Topics Concern   Not on file  Social History Narrative   Not on file   Social Drivers of Health   Tobacco Use: Low Risk (08/30/2024)   Patient History    Smoking Tobacco Use: Never    Smokeless Tobacco Use: Never    Passive Exposure: Not on file  Financial Resource Strain: Low Risk (08/30/2024)   Overall Financial Resource Strain (CARDIA)    Difficulty of Paying Living Expenses: Not hard at all  Food Insecurity: No Food Insecurity (08/30/2024)   Epic    Worried About Radiation Protection Practitioner of Food in the Last Year: Never true    Ran Out of Food in the Last Year: Never true  Transportation Needs: No Transportation Needs (08/30/2024)  Epic    Lack of Transportation (Medical): No    Lack of Transportation (Non-Medical): No  Physical Activity: Insufficiently Active (08/30/2024)   Exercise Vital Sign    Days of Exercise per Week: 2 days    Minutes of Exercise per Session: 30 min  Stress: Stress Concern Present (08/30/2024)   [employer] of Occupational Health - Occupational Stress Questionnaire    Feeling of Stress: To some extent  Social Connections: Moderately Isolated (08/30/2024)   Social Connection and Isolation Panel    Frequency of Communication with Friends and Family: More than three times a week    Frequency of Social Gatherings with Friends and Family: Once a week    Attends Religious Services: Never    Database Administrator or Organizations: No    Attends Engineer, Structural: Not on file    Marital Status: Married  Catering Manager Violence:  Not on file  Depression (EYV7-0): Not on file  Alcohol Screen: Medium Risk (08/30/2024)   Alcohol Screen    Last Alcohol Screening Score (AUDIT): 8  Housing: Unknown (08/30/2024)   Epic    Unable to Pay for Housing in the Last Year: No    Number of Times Moved in the Last Year: Not on file    Homeless in the Last Year: No  Utilities: Not At Risk (07/11/2022)   Received from [facility] Utilities    Threatened with loss of utilities: No  Health Literacy: Not on file    ROS    Objective:    BP 128/68   Pulse 72   Temp 98.4 F (36.9 C) (Oral)   Ht 5' 7 (1.702 m)   Wt 153 lb 6.4 oz (69.6 kg)   SpO2 98%   BMI 24.03 kg/m   Physical Exam {PhysExam Abridge (Optional):210964309}  {Labs (Optional):23779}    Assessment & Plan:   Problem List Items Addressed This Visit       Digestive   GERD (gastroesophageal reflux disease)     Musculoskeletal and Integument   Foraminal stenosis of cervical region     Other   Familial hyperlipidemia - Primary   Relevant Orders   Lipid panel   History of repair of hiatal hernia   Other Visit Diagnoses       Lower urinary tract symptoms (LUTS)         Diabetes mellitus screening       Relevant Orders   Hemoglobin A1c     Encounter for hepatitis C screening test for low risk patient         Encounter for lipid screening for cardiovascular disease         Screening for prostate cancer       Relevant Orders   PSA     Screening for iron deficiency anemia         Loss of libido       Relevant Orders   TSH + free T4   Testosterone,Free and Total     Raynaud's phenomenon without gangrene       Relevant Orders   Hepatic function panel       Assessment and Plan Assessment & Plan     No follow-ups on file.   Tatiana Courter K Timber Lucarelli, MD [facility]     "

## 2024-10-28 ENCOUNTER — Ambulatory Visit

## 2024-11-28 ENCOUNTER — Encounter: Admitting: Family Medicine
